# Patient Record
Sex: Male | Born: 1945 | Race: White | Hispanic: No | Marital: Single | Smoking: Former smoker
Health system: Southern US, Community
[De-identification: ages and names within clinical notes are randomized; demographics above are authoritative.]

## PROBLEM LIST (undated history)

## (undated) DIAGNOSIS — C61 Malignant neoplasm of prostate: Secondary | ICD-10-CM

## (undated) DIAGNOSIS — I10 Essential (primary) hypertension: Secondary | ICD-10-CM

## (undated) DIAGNOSIS — E119 Type 2 diabetes mellitus without complications: Secondary | ICD-10-CM

## (undated) DIAGNOSIS — J45909 Unspecified asthma, uncomplicated: Secondary | ICD-10-CM

## (undated) DIAGNOSIS — D519 Vitamin B12 deficiency anemia, unspecified: Secondary | ICD-10-CM

## (undated) DIAGNOSIS — L409 Psoriasis, unspecified: Secondary | ICD-10-CM

## (undated) DIAGNOSIS — I255 Ischemic cardiomyopathy: Secondary | ICD-10-CM

## (undated) DIAGNOSIS — E039 Hypothyroidism, unspecified: Secondary | ICD-10-CM

## (undated) DIAGNOSIS — I2510 Atherosclerotic heart disease of native coronary artery without angina pectoris: Secondary | ICD-10-CM

## (undated) DIAGNOSIS — I219 Acute myocardial infarction, unspecified: Secondary | ICD-10-CM

## (undated) HISTORY — PX: APPENDECTOMY: SHX54

## (undated) HISTORY — PX: CORONARY ARTERY BYPASS GRAFT: SHX141

## (undated) HISTORY — PX: OTHER SURGICAL HISTORY: SHX169

---

## 2007-09-29 ENCOUNTER — Ambulatory Visit: Payer: Self-pay | Admitting: Gastroenterology

## 2008-03-08 ENCOUNTER — Ambulatory Visit: Payer: Self-pay | Admitting: Urology

## 2010-07-25 ENCOUNTER — Ambulatory Visit: Payer: Self-pay

## 2011-12-28 ENCOUNTER — Emergency Department: Payer: Self-pay | Admitting: Emergency Medicine

## 2014-01-02 DIAGNOSIS — L4050 Arthropathic psoriasis, unspecified: Secondary | ICD-10-CM | POA: Diagnosis present

## 2014-01-02 DIAGNOSIS — I2510 Atherosclerotic heart disease of native coronary artery without angina pectoris: Secondary | ICD-10-CM | POA: Insufficient documentation

## 2014-10-13 DIAGNOSIS — K649 Unspecified hemorrhoids: Secondary | ICD-10-CM | POA: Diagnosis not present

## 2014-10-13 DIAGNOSIS — Z08 Encounter for follow-up examination after completed treatment for malignant neoplasm: Secondary | ICD-10-CM | POA: Diagnosis not present

## 2014-10-13 DIAGNOSIS — N529 Male erectile dysfunction, unspecified: Secondary | ICD-10-CM | POA: Diagnosis not present

## 2014-10-13 DIAGNOSIS — R32 Unspecified urinary incontinence: Secondary | ICD-10-CM | POA: Diagnosis not present

## 2014-10-13 DIAGNOSIS — C61 Malignant neoplasm of prostate: Secondary | ICD-10-CM | POA: Diagnosis not present

## 2014-10-13 DIAGNOSIS — Z8546 Personal history of malignant neoplasm of prostate: Secondary | ICD-10-CM | POA: Diagnosis not present

## 2014-10-17 DIAGNOSIS — M1990 Unspecified osteoarthritis, unspecified site: Secondary | ICD-10-CM | POA: Diagnosis not present

## 2014-10-17 DIAGNOSIS — M150 Primary generalized (osteo)arthritis: Secondary | ICD-10-CM | POA: Diagnosis not present

## 2014-10-17 DIAGNOSIS — C61 Malignant neoplasm of prostate: Secondary | ICD-10-CM | POA: Diagnosis not present

## 2014-10-17 DIAGNOSIS — L4050 Arthropathic psoriasis, unspecified: Secondary | ICD-10-CM | POA: Diagnosis not present

## 2015-01-10 DIAGNOSIS — R0781 Pleurodynia: Secondary | ICD-10-CM | POA: Diagnosis not present

## 2015-01-10 DIAGNOSIS — R05 Cough: Secondary | ICD-10-CM | POA: Diagnosis not present

## 2015-01-18 DIAGNOSIS — I2510 Atherosclerotic heart disease of native coronary artery without angina pectoris: Secondary | ICD-10-CM | POA: Diagnosis not present

## 2015-01-18 DIAGNOSIS — J4522 Mild intermittent asthma with status asthmaticus: Secondary | ICD-10-CM | POA: Diagnosis not present

## 2015-01-18 DIAGNOSIS — D510 Vitamin B12 deficiency anemia due to intrinsic factor deficiency: Secondary | ICD-10-CM | POA: Diagnosis not present

## 2015-01-18 DIAGNOSIS — I10 Essential (primary) hypertension: Secondary | ICD-10-CM | POA: Diagnosis not present

## 2015-01-31 ENCOUNTER — Other Ambulatory Visit: Payer: Self-pay

## 2015-01-31 ENCOUNTER — Emergency Department: Payer: Commercial Managed Care - HMO

## 2015-01-31 ENCOUNTER — Encounter: Payer: Self-pay | Admitting: Emergency Medicine

## 2015-01-31 ENCOUNTER — Emergency Department
Admission: EM | Admit: 2015-01-31 | Discharge: 2015-01-31 | Disposition: A | Discharge status: Home / Self Care | Payer: Commercial Managed Care - HMO | Attending: Emergency Medicine | Admitting: Emergency Medicine

## 2015-01-31 DIAGNOSIS — R079 Chest pain, unspecified: Secondary | ICD-10-CM | POA: Diagnosis not present

## 2015-01-31 DIAGNOSIS — I10 Essential (primary) hypertension: Secondary | ICD-10-CM | POA: Diagnosis not present

## 2015-01-31 DIAGNOSIS — J90 Pleural effusion, not elsewhere classified: Secondary | ICD-10-CM | POA: Diagnosis not present

## 2015-01-31 DIAGNOSIS — R0602 Shortness of breath: Secondary | ICD-10-CM | POA: Diagnosis not present

## 2015-01-31 DIAGNOSIS — M6281 Muscle weakness (generalized): Secondary | ICD-10-CM | POA: Insufficient documentation

## 2015-01-31 DIAGNOSIS — J45901 Unspecified asthma with (acute) exacerbation: Secondary | ICD-10-CM | POA: Diagnosis not present

## 2015-01-31 DIAGNOSIS — J111 Influenza due to unidentified influenza virus with other respiratory manifestations: Secondary | ICD-10-CM | POA: Diagnosis not present

## 2015-01-31 DIAGNOSIS — Z87891 Personal history of nicotine dependence: Secondary | ICD-10-CM | POA: Diagnosis not present

## 2015-01-31 DIAGNOSIS — J209 Acute bronchitis, unspecified: Secondary | ICD-10-CM

## 2015-01-31 DIAGNOSIS — R55 Syncope and collapse: Secondary | ICD-10-CM | POA: Insufficient documentation

## 2015-01-31 DIAGNOSIS — E119 Type 2 diabetes mellitus without complications: Secondary | ICD-10-CM | POA: Diagnosis not present

## 2015-01-31 DIAGNOSIS — R42 Dizziness and giddiness: Secondary | ICD-10-CM | POA: Diagnosis not present

## 2015-01-31 DIAGNOSIS — A419 Sepsis, unspecified organism: Secondary | ICD-10-CM | POA: Diagnosis not present

## 2015-01-31 HISTORY — DX: Type 2 diabetes mellitus without complications: E11.9

## 2015-01-31 HISTORY — DX: Acute myocardial infarction, unspecified: I21.9

## 2015-01-31 HISTORY — DX: Malignant neoplasm of prostate: C61

## 2015-01-31 HISTORY — DX: Essential (primary) hypertension: I10

## 2015-01-31 HISTORY — DX: Unspecified asthma, uncomplicated: J45.909

## 2015-01-31 LAB — CBC WITH DIFFERENTIAL/PLATELET
BASOS PCT: 0 %
Basophils Absolute: 0.1 10*3/uL (ref 0–0.1)
EOS ABS: 0.1 10*3/uL (ref 0–0.7)
Eosinophils Relative: 1 %
HCT: 37.5 % — ABNORMAL LOW (ref 40.0–52.0)
Hemoglobin: 13.3 g/dL (ref 13.0–18.0)
Lymphocytes Relative: 8 %
Lymphs Abs: 0.9 10*3/uL — ABNORMAL LOW (ref 1.0–3.6)
MCH: 30.2 pg (ref 26.0–34.0)
MCHC: 35.6 g/dL (ref 32.0–36.0)
MCV: 84.9 fL (ref 80.0–100.0)
Monocytes Absolute: 1.6 10*3/uL — ABNORMAL HIGH (ref 0.2–1.0)
Monocytes Relative: 12 %
NEUTROS PCT: 79 %
Neutro Abs: 10 10*3/uL — ABNORMAL HIGH (ref 1.4–6.5)
PLATELETS: 171 10*3/uL (ref 150–440)
RBC: 4.41 MIL/uL (ref 4.40–5.90)
RDW: 13.5 % (ref 11.5–14.5)
WBC: 12.7 10*3/uL — ABNORMAL HIGH (ref 3.8–10.6)

## 2015-01-31 LAB — COMPREHENSIVE METABOLIC PANEL
ALK PHOS: 44 U/L (ref 38–126)
ALT: 20 U/L (ref 17–63)
ANION GAP: 9 (ref 5–15)
AST: 22 U/L (ref 15–41)
Albumin: 3.3 g/dL — ABNORMAL LOW (ref 3.5–5.0)
BILIRUBIN TOTAL: 2.1 mg/dL — AB (ref 0.3–1.2)
BUN: 24 mg/dL — AB (ref 6–20)
CHLORIDE: 99 mmol/L — AB (ref 101–111)
CO2: 23 mmol/L (ref 22–32)
Calcium: 8.6 mg/dL — ABNORMAL LOW (ref 8.9–10.3)
Creatinine, Ser: 1.13 mg/dL (ref 0.61–1.24)
GFR calc non Af Amer: >60 mL/min (ref >60)
GLUCOSE: 134 mg/dL — AB (ref 65–99)
Potassium: 3.4 mmol/L — ABNORMAL LOW (ref 3.5–5.1)
SODIUM: 131 mmol/L — AB (ref 135–145)
TOTAL PROTEIN: 6.5 g/dL (ref 6.5–8.1)

## 2015-01-31 LAB — TROPONIN I: Troponin I: <0.03 ng/mL (ref <0.031)

## 2015-01-31 MED ORDER — IOHEXOL 350 MG/ML SOLN
100.0000 mL | Freq: Once | INTRAVENOUS | Status: AC | PRN
  Administered 2015-01-31: 100 mL via INTRAVENOUS

## 2015-01-31 MED ORDER — IPRATROPIUM-ALBUTEROL 0.5-2.5 (3) MG/3ML IN SOLN
3.0000 mL | Freq: Once | RESPIRATORY_TRACT | Status: AC
  Administered 2015-01-31: 3 mL via RESPIRATORY_TRACT

## 2015-01-31 MED ORDER — IPRATROPIUM-ALBUTEROL 0.5-2.5 (3) MG/3ML IN SOLN
RESPIRATORY_TRACT | Status: AC
  Administered 2015-01-31: 3 mL via RESPIRATORY_TRACT
  Filled 2015-01-31: qty 3

## 2015-01-31 MED ORDER — SODIUM CHLORIDE 0.9 % IV BOLUS (SEPSIS)
1000.0000 mL | Freq: Once | INTRAVENOUS | Status: AC
  Administered 2015-01-31: 1000 mL via INTRAVENOUS

## 2015-01-31 NOTE — ED Notes (Signed)
Returned from CT.

## 2015-01-31 NOTE — Discharge Instructions (Signed)
You were evaluated for coughing, left sided chest pain, low blood pressure with dizziness and your exam and evaluation are reassuring in the emergency department. Return to the emergency department for any fever, new or worsening chest pain, trouble breathing, shortness breath, weakness, numbness, passing out, or any other symptoms concerning to you. You need to see your regular doctor for a follow-up this week or early next week.  Acute Bronchitis Bronchitis is inflammation of the airways that extend from the windpipe into the lungs (bronchi). The inflammation often causes mucus to develop. This leads to a cough, which is the most common symptom of bronchitis.  In acute bronchitis, the condition usually develops suddenly and goes away over time, usually in a couple weeks. Smoking, allergies, and asthma can make bronchitis worse. Repeated episodes of bronchitis may cause further lung problems.  CAUSES Acute bronchitis is most often caused by the same virus that causes a cold. The virus can spread from person to person (contagious) through coughing, sneezing, and touching contaminated objects. SIGNS AND SYMPTOMS   Cough.   Fever.   Coughing up mucus.   Body aches.   Chest congestion.   Chills.   Shortness of breath.   Sore throat.  DIAGNOSIS  Acute bronchitis is usually diagnosed through a physical exam. Your health care provider will also ask you questions about your medical history. Tests, such as chest X-rays, are sometimes done to rule out other conditions.  TREATMENT  Acute bronchitis usually goes away in a couple weeks. Oftentimes, no medical treatment is necessary. Medicines are sometimes given for relief of fever or cough. Antibiotic medicines are usually not needed but may be prescribed in certain situations. In some cases, an inhaler may be recommended to help reduce shortness of breath and control the cough. A cool mist vaporizer may also be used to help thin bronchial  secretions and make it easier to clear the chest.  HOME CARE INSTRUCTIONS  Get plenty of rest.   Drink enough fluids to keep your urine clear or pale yellow (unless you have a medical condition that requires fluid restriction). Increasing fluids may help thin your respiratory secretions (sputum) and reduce chest congestion, and it will prevent dehydration.   Take medicines only as directed by your health care provider.  If you were prescribed an antibiotic medicine, finish it all even if you start to feel better.  Avoid smoking and secondhand smoke. Exposure to cigarette smoke or irritating chemicals will make bronchitis worse. If you are a smoker, consider using nicotine gum or skin patches to help control withdrawal symptoms. Quitting smoking will help your lungs heal faster.   Reduce the chances of another bout of acute bronchitis by washing your hands frequently, avoiding people with cold symptoms, and trying not to touch your hands to your mouth, nose, or eyes.   Keep all follow-up visits as directed by your health care provider.  SEEK MEDICAL CARE IF: Your symptoms do not improve after 1 week of treatment.  SEEK IMMEDIATE MEDICAL CARE IF:  You develop an increased fever or chills.   You have chest pain.   You have severe shortness of breath.  You have bloody sputum.   You develop dehydration.  You faint or repeatedly feel like you are going to pass out.  You develop repeated vomiting.  You develop a severe headache. MAKE SURE YOU:   Understand these instructions.  Will watch your condition.  Will get help right away if you are not doing well or  get worse. Document Released: 10/10/2004 Document Revised: 01/17/2014 Document Reviewed: 02/23/2013 [facility] Patient Information 2015 [place], [place]. This information is not intended to replace advice given to you by your health care provider. Make sure you discuss any questions you have with your health care  provider.

## 2015-01-31 NOTE — ED Notes (Signed)
Pt to ed with c/o chest pain that started about 10 am today.  Pt reports pain is in left side of chest, reports sob, weakness, dizziness, nausea and profuse diaphoresis with pain.

## 2015-01-31 NOTE — ED Provider Notes (Signed)
[facility] Emergency Department Provider Note   ____________________________________________  Time seen: 5:30 PM I have reviewed the triage vital signs and the triage nursing note.  HISTORY  Chief Complaint Chest Pain   Historian Patient, limited as he is a poor historian Son provides more history  HPI Antonio Barnes is a 69 y.o. male whose complaint is generalized weakness, low blood pressure with dizziness and near-syncope at his primary care doctor's office today. He has been sick for "5 weeks ". He's had shortness of breath and wheezing. He's been treated with 3 courses of prednisone per the patient and swell as breathing treatment inhalers. He's had 2 courses of antibiotics which appears to have been Augmentin. He had a chest x-ray approximately 3-4 weeks ago when he was having left rib pain. For the past 1-2 days he's had very little to eat and drink and is concerned he may be dehydrated. He does take blood pressure pills. 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 His son feels like his color is off.He reports continued shortness of breath. He did have some chest pain around 10 AM today on the left side with nausea and sweating. No chest pain now    Past Medical History  Diagnosis Date  . Hypertension   . Diabetes mellitus without complication   . Prostate cancer   . Myocardial infarct   . Asthma     There are no active problems to display for this patient.   Past Surgical History  Procedure Laterality Date  . Coronary artery bypass graft      No current outpatient prescriptions on file.  Allergies Review of patient's allergies indicates no known allergies.  No family history on file.  Social History History  Substance Use Topics  . Smoking status: Former Research scientist (life sciences)  . Smokeless tobacco: Not on file  . Alcohol Use: Yes    Review of Systems  Constitutional: Negative for fever. Positive for sweats at night and during the day. Eyes:  Negative for visual changes. ENT: Negative for sore throat. Cardiovascular: No palpitations Respiratory: Wheezing and shortness of breath per history of present illness Gastrointestinal: Negative for abdominal pain, vomiting and diarrhea. Genitourinary: Negative for dysuria. Musculoskeletal: Negative for back pain. Skin: Negative for rash. Neurological: Negative for headaches, focal weakness or numbness.  ____________________________________________   PHYSICAL EXAM:  VITAL SIGNS: ED Triage Vitals  Enc Vitals Group     BP 01/31/15 1649 97/58 mmHg     Pulse Rate 01/31/15 1649 76     Resp 01/31/15 1649 20     Temp 01/31/15 1649 98.9 F (37.2 C)     Temp Source 01/31/15 1649 Oral     SpO2 01/31/15 1649 94 %     Weight 01/31/15 1649 215 lb (97.523 kg)     Height 01/31/15 1649 6\' 2"  (1.88 m)     Head Cir --      Peak Flow --      Pain Score 01/31/15 1651 5     Pain Loc --      Pain Edu? --      Excl. in [place]? --      Constitutional: Alert and oriented. Well appearing and in no distress. However slow to answer questions. Poor historian Eyes: Conjunctivae are normal. PERRL. Normal extraocular movements. ENT   Head: Normocephalic and atraumatic.   Nose: No congestion/rhinnorhea.   Mouth/Throat: Mucous membranes are moderately dry.   Neck: No stridor. Cardiovascular: Normal rate, regular rhythm.  No murmurs, rubs, or gallops. Respiratory: Some wheezing and rhonchi throughout all fields, however no respiratory distress.. Gastrointestinal: Soft and nontender. No distention.  Genitourinary: Musculoskeletal: Nontender with normal range of motion in all extremities. No joint effusions.  No lower extremity tenderness nor edema. Neurologic:  Normal speech and language. No gross focal neurologic deficits are appreciated. Speech is normal. Slow to answer questions but appropriate and following commands. Skin:  Skin is warm, dry and intact. No rash noted. Psychiatric: Mood and  affect are normal.  ____________________________________________   EKG  74 bpm. Normal sinus rhythm. Normal axis. Left ventricular hypertrophy with repolarization changes. ____________________________________________  LABS (pertinent positives/negatives)  Sodium 131 Potassium 3.4 BUN 24 with cranial 1.13 and a GFR of greater than 60 Troponin less than 0.03 White blood cell count 12.7 and hemoglobin 13.3  ____________________________________________  RADIOLOGY Radiologist results reviewed  Chest x-ray: New small left pleural effusion and left base atelectasis with slight cardiomegaly  CT and chest: Negative for PE or thoracic aortic dissection: Small left pleural effusion with adjacent consolidation/atelectasis in the posterior left lower lobe __________________________________________  PROCEDURES  Procedure(s) performed: No Critical Care performed: No  ____________________________________________   ED COURSE / ASSESSMENT AND PLAN  Pertinent labs & imaging results that were available during my care of the patient were reviewed by me and considered in my medical decision making (see chart for details).  Patient has had wheezing for several weeks and been treated for bronchitis by his primary care physician. Some pleuritic chest pain and wheezing, after nonstick chest x-ray a CT scan to rule out PE was obtained and there is no acute abnormality seen there. I suspect findings on the left lung R due to atelectasis. I do not evaluate and adding additional advice at this point in time. Patient is to follow-up with primary care physician and possibly be referred to pulmonology.    Patient / Family / Caregiver informed of clinical course, understand medical decision-making process, and agree with plan.  ___________________________________________   FINAL CLINICAL IMPRESSION(S) / ED DIAGNOSES   Acute bronchitis, unspecified organism    Lisa Roca, MD 02/03/15 1339

## 2015-02-20 DIAGNOSIS — D510 Vitamin B12 deficiency anemia due to intrinsic factor deficiency: Secondary | ICD-10-CM | POA: Diagnosis not present

## 2015-02-20 DIAGNOSIS — I2510 Atherosclerotic heart disease of native coronary artery without angina pectoris: Secondary | ICD-10-CM | POA: Diagnosis not present

## 2015-02-27 DIAGNOSIS — J90 Pleural effusion, not elsewhere classified: Secondary | ICD-10-CM | POA: Diagnosis not present

## 2015-02-27 DIAGNOSIS — I2510 Atherosclerotic heart disease of native coronary artery without angina pectoris: Secondary | ICD-10-CM | POA: Diagnosis not present

## 2015-02-27 DIAGNOSIS — E039 Hypothyroidism, unspecified: Secondary | ICD-10-CM | POA: Diagnosis not present

## 2015-02-27 DIAGNOSIS — D510 Vitamin B12 deficiency anemia due to intrinsic factor deficiency: Secondary | ICD-10-CM | POA: Diagnosis not present

## 2015-02-27 DIAGNOSIS — Z0000 Encounter for general adult medical examination without abnormal findings: Secondary | ICD-10-CM | POA: Diagnosis not present

## 2015-03-13 ENCOUNTER — Other Ambulatory Visit: Payer: Self-pay | Admitting: Specialist

## 2015-03-13 DIAGNOSIS — J90 Pleural effusion, not elsewhere classified: Secondary | ICD-10-CM | POA: Diagnosis not present

## 2015-03-16 ENCOUNTER — Ambulatory Visit: Payer: Commercial Managed Care - HMO

## 2015-03-17 ENCOUNTER — Other Ambulatory Visit: Payer: Self-pay | Admitting: Radiology

## 2015-03-21 ENCOUNTER — Ambulatory Visit
Admission: RE | Admit: 2015-03-21 | Discharge: 2015-03-21 | Disposition: A | Discharge status: Home / Self Care | Payer: Commercial Managed Care - HMO | Source: Ambulatory Visit | Attending: Specialist | Admitting: Specialist

## 2015-03-21 DIAGNOSIS — J90 Pleural effusion, not elsewhere classified: Secondary | ICD-10-CM | POA: Insufficient documentation

## 2015-03-21 DIAGNOSIS — M1990 Unspecified osteoarthritis, unspecified site: Secondary | ICD-10-CM | POA: Insufficient documentation

## 2015-03-21 DIAGNOSIS — L409 Psoriasis, unspecified: Secondary | ICD-10-CM | POA: Insufficient documentation

## 2015-03-21 DIAGNOSIS — J45909 Unspecified asthma, uncomplicated: Secondary | ICD-10-CM | POA: Insufficient documentation

## 2015-03-21 DIAGNOSIS — I10 Essential (primary) hypertension: Secondary | ICD-10-CM | POA: Diagnosis not present

## 2015-03-21 DIAGNOSIS — Z8546 Personal history of malignant neoplasm of prostate: Secondary | ICD-10-CM | POA: Diagnosis not present

## 2015-03-21 DIAGNOSIS — I2510 Atherosclerotic heart disease of native coronary artery without angina pectoris: Secondary | ICD-10-CM | POA: Diagnosis not present

## 2015-03-21 DIAGNOSIS — Z9889 Other specified postprocedural states: Secondary | ICD-10-CM | POA: Diagnosis not present

## 2015-03-21 DIAGNOSIS — E039 Hypothyroidism, unspecified: Secondary | ICD-10-CM | POA: Diagnosis not present

## 2015-03-21 DIAGNOSIS — D519 Vitamin B12 deficiency anemia, unspecified: Secondary | ICD-10-CM | POA: Diagnosis not present

## 2015-03-21 DIAGNOSIS — J984 Other disorders of lung: Secondary | ICD-10-CM | POA: Diagnosis not present

## 2015-03-21 LAB — PROTEIN, BODY FLUID: Total protein, fluid: 5.2 g/dL

## 2015-03-21 LAB — BODY FLUID CELL COUNT WITH DIFFERENTIAL
Eos, Fluid: 0 %
Lymphs, Fluid: 81 %
MONOCYTE-MACROPHAGE-SEROUS FLUID: 12 % — AB (ref 50–90)
NEUTROPHIL FLUID: 7 % (ref 0–25)
OTHER CELLS FL: 0 %
Total Nucleated Cell Count, Fluid: 1913 cu mm — ABNORMAL HIGH (ref 0–1000)

## 2015-03-21 LAB — LACTATE DEHYDROGENASE, PLEURAL OR PERITONEAL FLUID: LD, Fluid: 338 U/L — ABNORMAL HIGH (ref 3–23)

## 2015-03-21 LAB — GLUCOSE, SEROUS FLUID: Glucose, Fluid: 103 mg/dL

## 2015-03-21 NOTE — Procedures (Signed)
Informed consent obtained. Under US guidance, left thoracentesis was performed. CXR to follow. No immediate complications.

## 2015-03-24 LAB — BODY FLUID CULTURE: Culture: NO GROWTH

## 2015-03-27 DIAGNOSIS — Z111 Encounter for screening for respiratory tuberculosis: Secondary | ICD-10-CM | POA: Diagnosis not present

## 2015-03-27 DIAGNOSIS — J90 Pleural effusion, not elsewhere classified: Secondary | ICD-10-CM | POA: Diagnosis not present

## 2015-03-30 LAB — CYTOLOGY - NON PAP

## 2015-04-07 LAB — PATHOLOGIST SMEAR REVIEW

## 2015-04-11 LAB — FUNGUS CULTURE W SMEAR: Culture: NO GROWTH

## 2015-04-17 DIAGNOSIS — J90 Pleural effusion, not elsewhere classified: Secondary | ICD-10-CM | POA: Diagnosis not present

## 2015-04-17 DIAGNOSIS — Z8709 Personal history of other diseases of the respiratory system: Secondary | ICD-10-CM | POA: Diagnosis not present

## 2015-04-18 DIAGNOSIS — J90 Pleural effusion, not elsewhere classified: Secondary | ICD-10-CM | POA: Diagnosis not present

## 2015-05-04 LAB — ACID FAST SMEAR+CULTURE W/RFLX (ARMC ONLY)
Acid Fast Culture: NEGATIVE
Acid Fast Smear: NEGATIVE

## 2015-05-16 DIAGNOSIS — L4050 Arthropathic psoriasis, unspecified: Secondary | ICD-10-CM | POA: Diagnosis not present

## 2015-06-23 DIAGNOSIS — L400 Psoriasis vulgaris: Secondary | ICD-10-CM | POA: Diagnosis not present

## 2015-06-23 DIAGNOSIS — Z79899 Other long term (current) drug therapy: Secondary | ICD-10-CM | POA: Diagnosis not present

## 2015-07-11 IMAGING — CR DG CHEST 2V
2 series · 2 of 2 positions shown · non-contrast
Comparison: CT scan of the chest dated January 31, 2015.

CLINICAL DATA: Post thoracentesis chest x-ray

EXAM:
CHEST  2 VIEW

[chest pa]
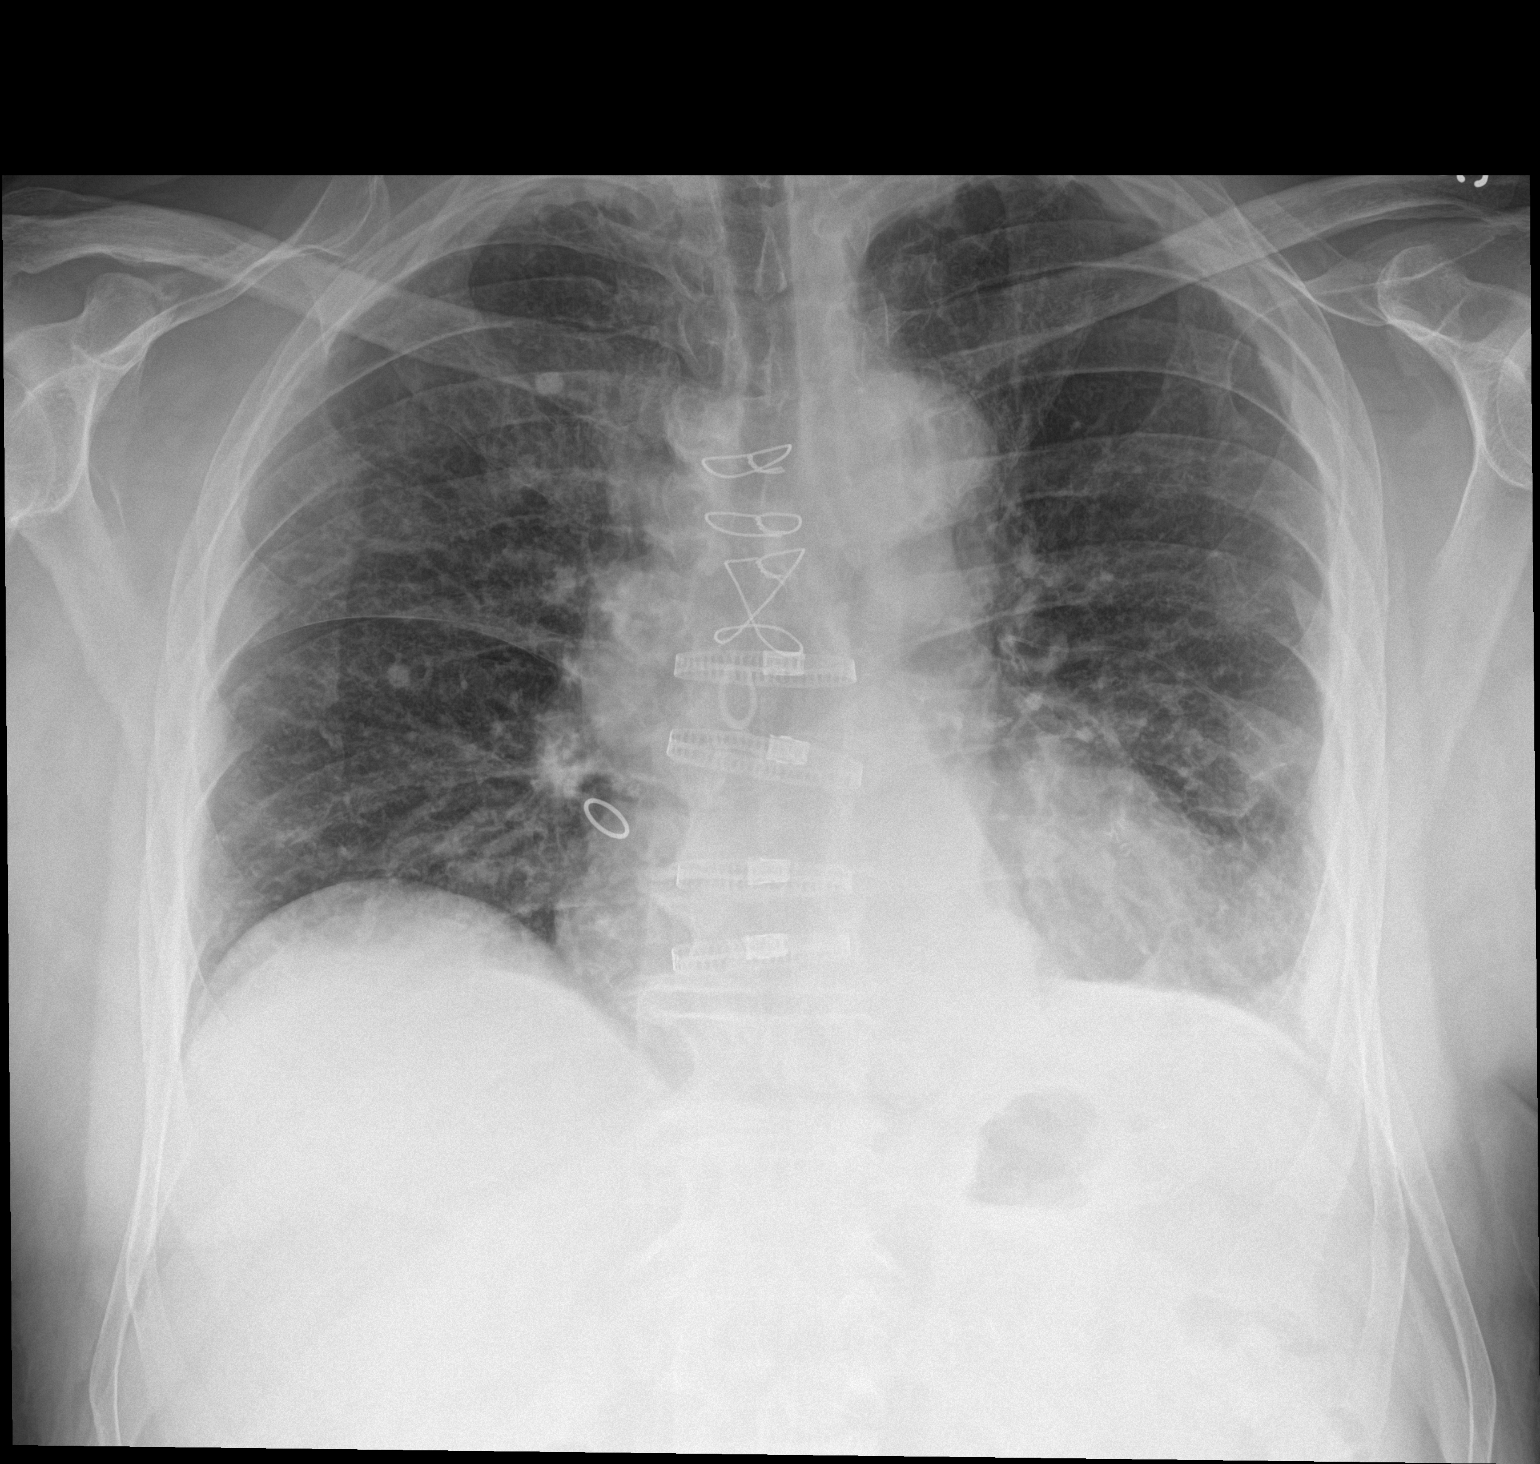

[chest lat]
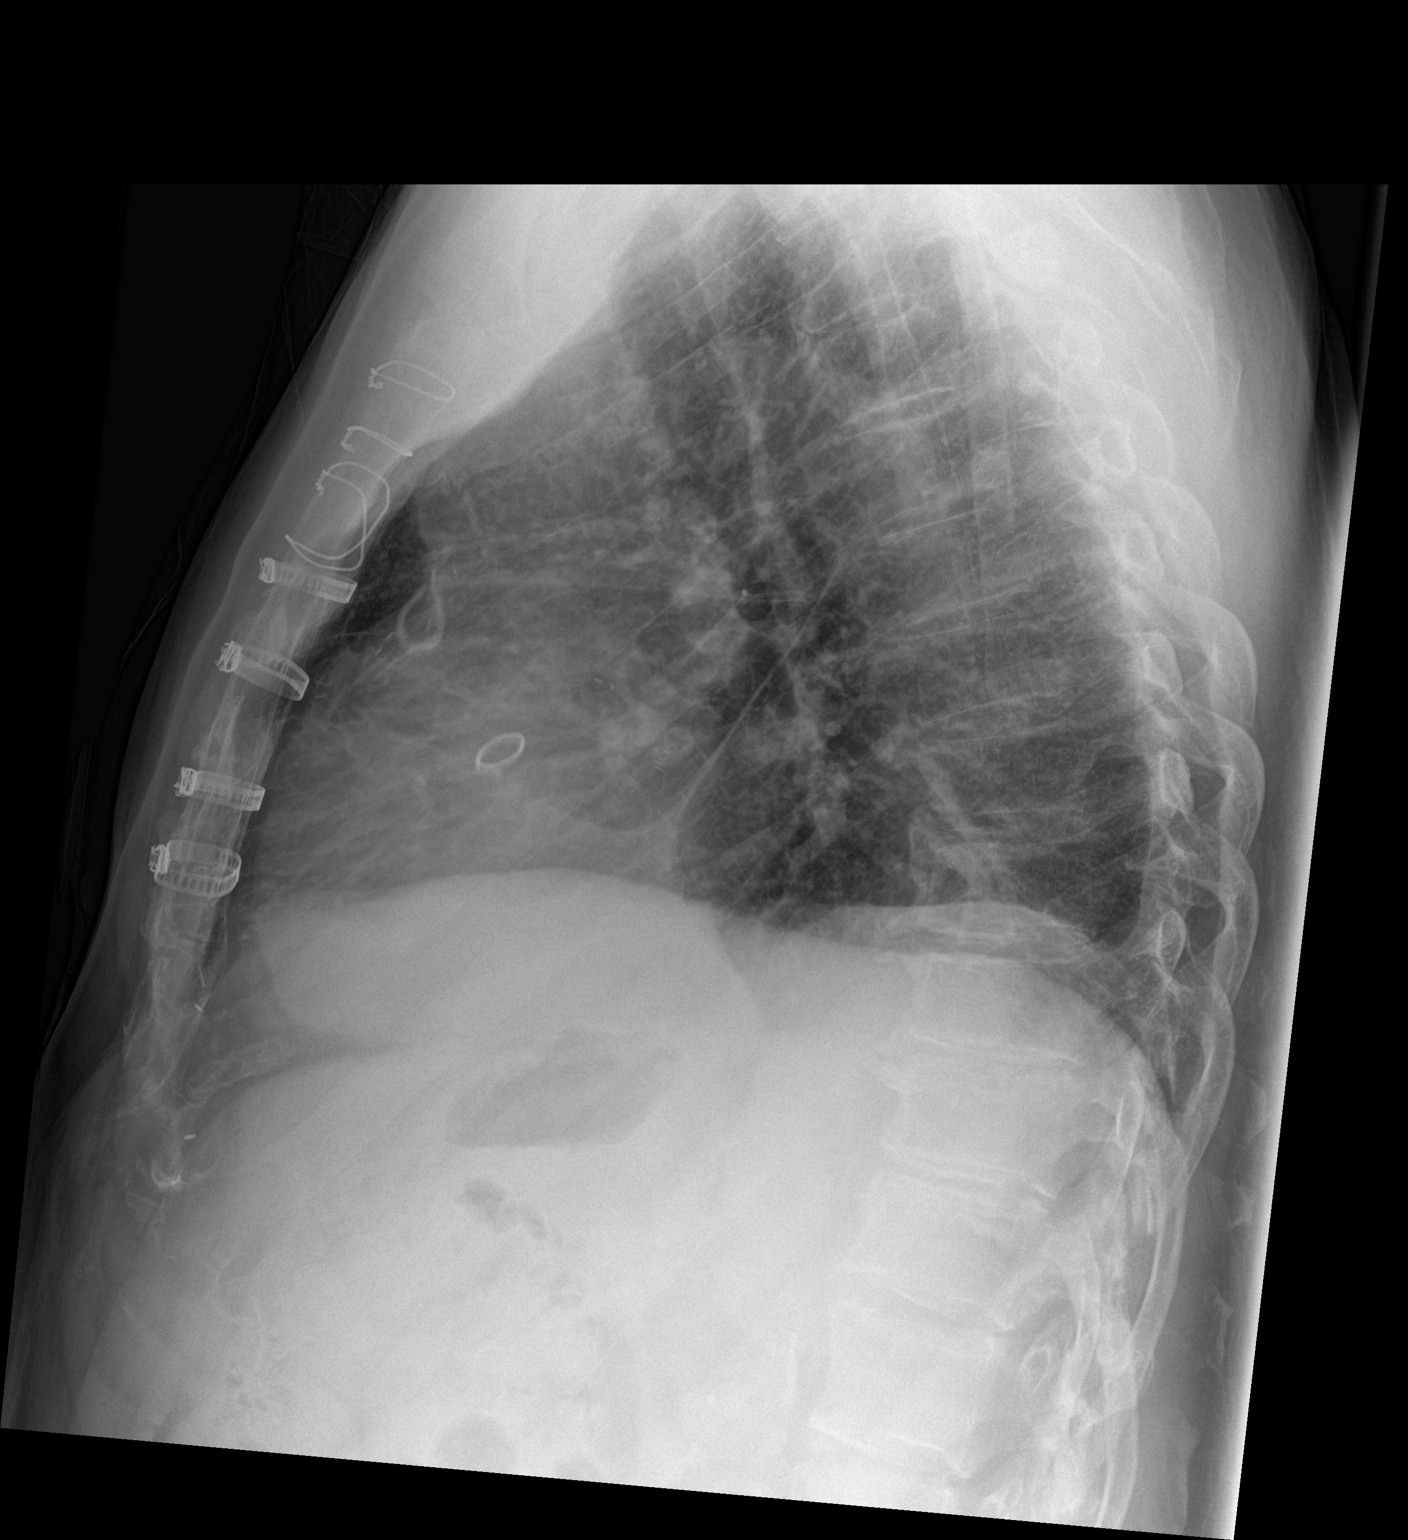

[2 of 2 positions shown; findings below may reference images not displayed]

FINDINGS: The patient has undergone left-sided thoracentesis. A small amount
of pleural fluid remains. There is no pneumothorax. There is stable
retrosternal soft tissue density when compared to a chest x-ray July 25, 2010. There is no mediastinal shift. The heart is
top-normal in size. The pulmonary vascularity is not engorged. The
pulmonary interstitial markings are mildly increased. There are 4
intact sternal clamps in 3 intact sternal wires from previous CABG.
No acute bony abnormality is observed.
IMPRESSION: The patient has undergone successful left-sided thoracentesis
without evidence of postprocedure complication.

## 2015-07-11 IMAGING — US US THORACENTESIS ASP PLEURAL SPACE W/IMG GUIDE
1 series · 2 of 2 positions shown · non-contrast
Comparison: None.

CLINICAL DATA: Left pleural effusion.

EXAM:
ULTRASOUND GUIDED left THORACENTESIS

[Series 1: us thoracentesis asp pleural space w/img guide · 0.26mm/px · 2 of 2 slices shown]
[im 1/2]
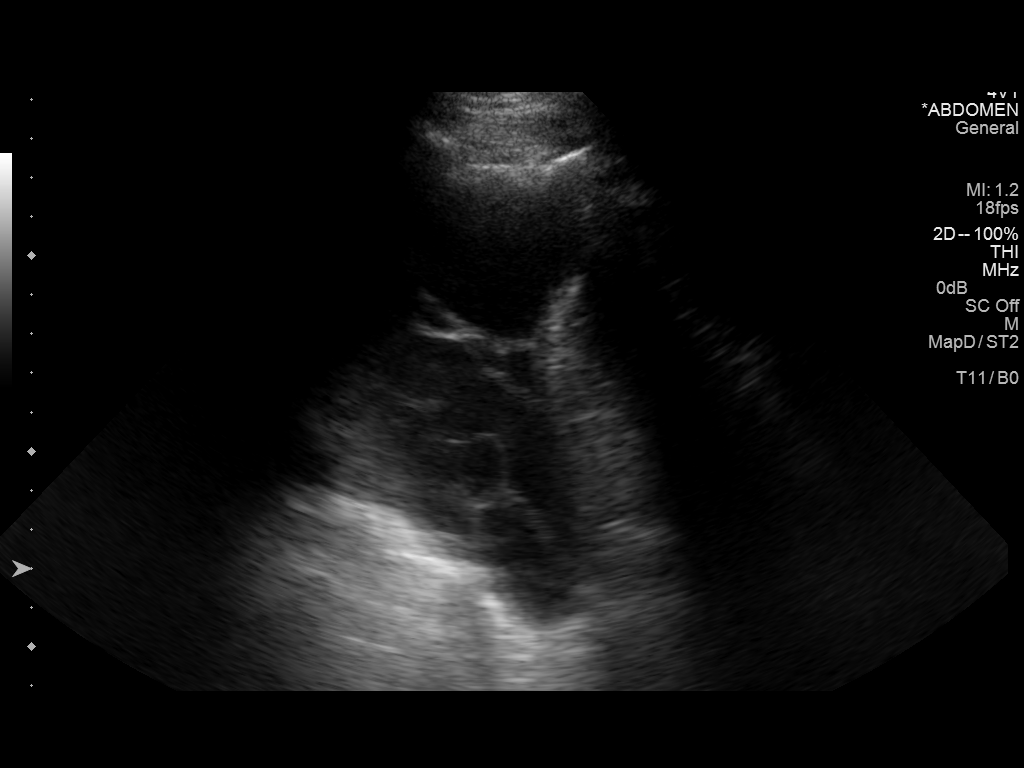
[im 2/2]
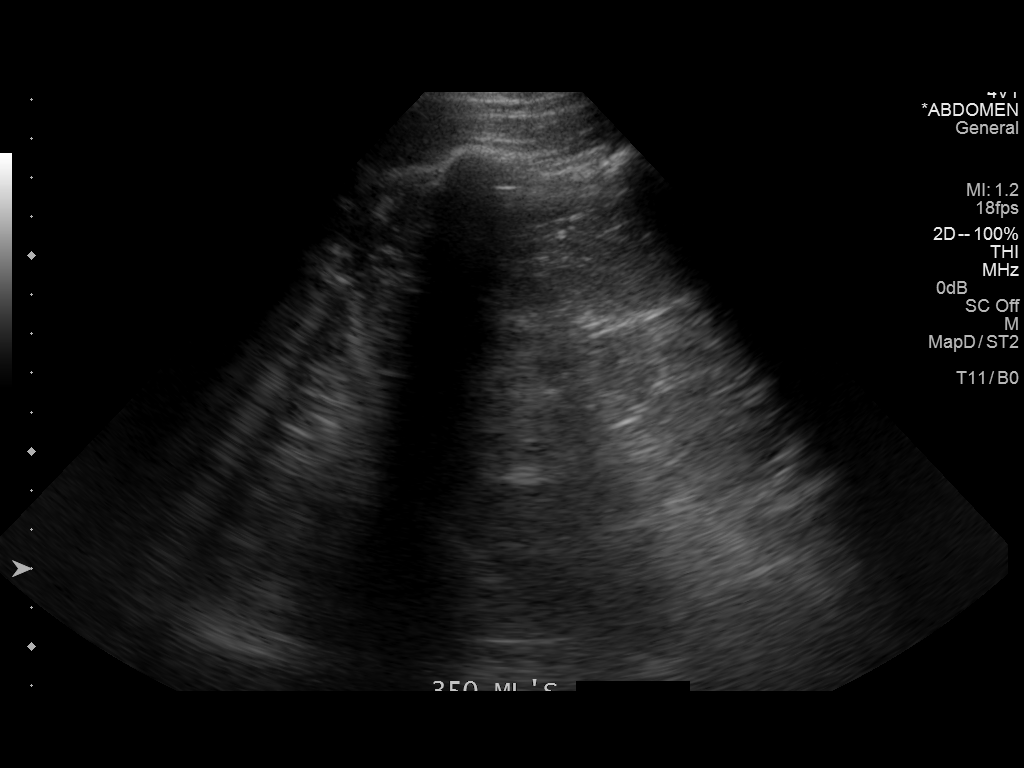

[2 of 2 positions shown; findings below may reference images not displayed]

PROCEDURE:
An ultrasound guided thoracentesis was thoroughly discussed with the
patient and questions answered. The benefits, risks, alternatives
and complications were also discussed. The patient understands and
wishes to proceed with the procedure. Written consent was obtained.

Ultrasound was performed to localize and mark an adequate pocket of
fluid in the left chest. The area was then prepped and draped in the
normal sterile fashion. 1% Lidocaine was used for local anesthesia.
Under ultrasound guidance a Safe-T-Centesis catheter was introduced.
Thoracentesis was performed. The catheter was removed and a dressing
applied.

Complications:  None immediate.
FINDINGS: A total of approximately 350 mL of serous fluid was removed. A fluid
sample wassent for laboratory analysis.
IMPRESSION: Successful ultrasound guided left thoracentesis yielding 350 mL of
pleural fluid.

## 2015-09-11 ENCOUNTER — Emergency Department
Admission: EM | Admit: 2015-09-11 | Discharge: 2015-09-11 | Disposition: A | Discharge status: Home / Self Care | Payer: Commercial Managed Care - HMO | Attending: Emergency Medicine | Admitting: Emergency Medicine

## 2015-09-11 ENCOUNTER — Encounter: Payer: Self-pay | Admitting: Emergency Medicine

## 2015-09-11 DIAGNOSIS — Z79899 Other long term (current) drug therapy: Secondary | ICD-10-CM | POA: Insufficient documentation

## 2015-09-11 DIAGNOSIS — Z87891 Personal history of nicotine dependence: Secondary | ICD-10-CM | POA: Diagnosis not present

## 2015-09-11 DIAGNOSIS — I10 Essential (primary) hypertension: Secondary | ICD-10-CM | POA: Insufficient documentation

## 2015-09-11 DIAGNOSIS — D571 Sickle-cell disease without crisis: Secondary | ICD-10-CM | POA: Diagnosis not present

## 2015-09-11 DIAGNOSIS — E119 Type 2 diabetes mellitus without complications: Secondary | ICD-10-CM | POA: Insufficient documentation

## 2015-09-11 DIAGNOSIS — J0100 Acute maxillary sinusitis, unspecified: Secondary | ICD-10-CM | POA: Diagnosis not present

## 2015-09-11 DIAGNOSIS — B9689 Other specified bacterial agents as the cause of diseases classified elsewhere: Secondary | ICD-10-CM

## 2015-09-11 DIAGNOSIS — Z7951 Long term (current) use of inhaled steroids: Secondary | ICD-10-CM | POA: Insufficient documentation

## 2015-09-11 DIAGNOSIS — N289 Disorder of kidney and ureter, unspecified: Secondary | ICD-10-CM | POA: Insufficient documentation

## 2015-09-11 DIAGNOSIS — L989 Disorder of the skin and subcutaneous tissue, unspecified: Secondary | ICD-10-CM | POA: Diagnosis present

## 2015-09-11 DIAGNOSIS — J320 Chronic maxillary sinusitis: Secondary | ICD-10-CM

## 2015-09-11 DIAGNOSIS — L089 Local infection of the skin and subcutaneous tissue, unspecified: Secondary | ICD-10-CM | POA: Diagnosis not present

## 2015-09-11 MED ORDER — FEXOFENADINE-PSEUDOEPHED ER 60-120 MG PO TB12: 1.0000 | ORAL_TABLET | Freq: Two times a day (BID) | ORAL | Status: DC

## 2015-09-11 MED ORDER — MUPIROCIN 2 % EX OINT: TOPICAL_OINTMENT | CUTANEOUS | Status: AC

## 2015-09-11 MED ORDER — CLINDAMYCIN HCL 150 MG PO CAPS: 150.0000 mg | ORAL_CAPSULE | Freq: Four times a day (QID) | ORAL | Status: DC

## 2015-09-11 NOTE — ED Notes (Signed)
Pt has scabbed area on right side of face next to his nose, and another scabbed area on his nose, right sided redness and swelling noted. Pt states he thinks he has a sinus infection, having nasal congestion past few days.

## 2015-09-11 NOTE — ED Provider Notes (Signed)
[facility] Emergency Department Provider Note  ____________________________________________  Time seen: Approximately 10:24 AM  I have reviewed the triage vital signs and the nursing notes.   HISTORY  Chief Complaint Skin Ulcer    HPI Antonio Barnes is a 69 y.o. male patient had 2 areas of erythematous papular lesions with some mild drainage on the facial area. First lesionright maxillary area and second lesion is on his right nostril area. Patient stated prior to appears this lesions she's had approximately 10 days of sinus congestion.  Patient denies any fever or chills associated this complaint. Patient rates his pain discomfort as a 2/10. No palliative measures taken for this complaint. Past Medical History  Diagnosis Date  . Hypertension   . Diabetes mellitus without complication ([place])   . Prostate cancer ([place])   . Myocardial infarct ([place])   . Asthma     There are no active problems to display for this patient.   Past Surgical History  Procedure Laterality Date  . Coronary artery bypass graft      Current Outpatient Rx  Name  Route  Sig  Dispense  Refill  . Albuterol (VENTOLIN IN)   Inhalation   Inhale into the lungs 2 (two) times daily.         Marland Kitchen amLODipine-benazepril (LOTREL) 5-20 MG per capsule   Oral   Take 1 capsule by mouth daily.         . betamethasone dipropionate (DIPROLENE) 0.05 % cream   Topical   Apply topically 2 (two) times daily.         . bisoprolol-hydrochlorothiazide (ZIAC) 2.5-6.25 MG per tablet   Oral   Take 1 tablet by mouth daily.         . clindamycin (CLEOCIN) 150 MG capsule   Oral   Take 1 capsule (150 mg total) by mouth 4 (four) times daily.   40 capsule   0   . cyanocobalamin 100 MCG tablet   Oral   Take 100 mcg by mouth daily.         Marland Kitchen Ephedrine-Guaifenesin 12.5-200 MG TABS   Oral   Take by mouth.         . ezetimibe (ZETIA) 10 MG tablet   Oral   Take 10 mg by mouth daily.        . fexofenadine-pseudoephedrine (ALLEGRA-D) 60-120 MG 12 hr tablet   Oral   Take 1 tablet by mouth 2 (two) times daily.   30 tablet   0   . levothyroxine (SYNTHROID, LEVOTHROID) 75 MCG tablet   Oral   Take 75 mcg by mouth daily before breakfast.         . mupirocin ointment (BACTROBAN) 2 %      Apply to affected area 3 times daily   22 g   0   . naproxen sodium (ANAPROX) 220 MG tablet   Oral   Take 220 mg by mouth as needed.         . sildenafil (REVATIO) 20 MG tablet   Oral   Take 40 mg by mouth every evening.         . simvastatin (ZOCOR) 40 MG tablet   Oral   Take 40 mg by mouth daily.           Allergies Review of patient's allergies indicates no known allergies.  No family history on file.  Social History Social History  Substance Use Topics  . Smoking status: Former Research scientist (life sciences)  .  Smokeless tobacco: None  . Alcohol Use: Yes    Review of Systems Constitutional: No fever/chills Eyes: No visual changes. ENT: No sore throat. Sinus congestion right facial swelling Cardiovascular: Denies chest pain. Respiratory: Denies shortness of breath. Gastrointestinal: No abdominal pain.  No nausea, no vomiting.  No diarrhea.  No constipation. Genitourinary: Negative for dysuria. Musculoskeletal: Negative for back pain. Skin: Negative for rash. Erythema and edema right facial area. Neurological: Negative for headaches, focal weakness or numbness. Endocrine:Hypertension and diabetes. Hematological/Lymphatic:Sickle cell anemia and renal insufficiency. 10-point ROS otherwise negative.  ____________________________________________   PHYSICAL EXAM:  VITAL SIGNS: ED Triage Vitals  Enc Vitals Group     BP 09/11/15 1001 171/103 mmHg     Pulse Rate 09/11/15 1001 73     Resp 09/11/15 1001 18     Temp 09/11/15 1001 97.6 F (36.4 C)     Temp Source 09/11/15 1001 Oral     SpO2 09/11/15 1001 97 %     Weight 09/11/15 1001 215 lb (97.523 kg)     Height 09/11/15  1001 6\' 2"  (1.88 m)     Head Cir --      Peak Flow --      Pain Score 09/11/15 1009 2     Pain Loc --      Pain Edu? --      Excl. in [place]? --     Constitutional: Alert and oriented. Well appearing and in no acute distress. Eyes: Conjunctivae are normal. PERRL. EOMI. Head: Atraumatic. Nose: Right maxillary guarding with epididymis bilateral nasal turbinates.. Mouth/Throat: Mucous membranes are moist.  Oropharynx non-erythematous. Neck: No stridor.  No cervical spine tenderness to palpation. Hematological/Lymphatic/Immunilogical: No cervical lymphadenopathy. Cardiovascular: Normal rate, regular rhythm. Grossly normal heart sounds.  Good peripheral circulation. Elevated blood pressure Respiratory: Normal respiratory effort.  No retractions. Lungs CTAB. Gastrointestinal: Soft and nontender. No distention. No abdominal bruits. No CVA tenderness. Musculoskeletal: No lower extremity tenderness nor edema.  No joint effusions. Neurologic:  Normal speech and language. No gross focal neurologic deficits are appreciated. No gait instability. Skin:  Skin is warm, dry and intact. No rash noted. Dry honey-colored crusted papular lesions right maxillary and right nostril area.  Psychiatric: Mood and affect are normal. Speech and behavior are normal.  ____________________________________________   LABS (all labs ordered are listed, but only abnormal results are displayed)  Labs Reviewed - No data to display ____________________________________________  EKG   ____________________________________________  RADIOLOGY   ____________________________________________   PROCEDURES  Procedure(s) performed: None  Critical Care performed: No  ____________________________________________   INITIAL IMPRESSION / ASSESSMENT AND PLAN / ED COURSE  Pertinent labs & imaging results that were available during my care of the patient were reviewed by me and considered in my medical decision making (see  chart for details).  Sinusitis and impetigo. Patient get a prescription for clindamycin, Bactroban, and Allegra-D. Patient advised follow-up family doctor in 3 days for reevaluation. ____________________________________________   FINAL CLINICAL IMPRESSION(S) / ED DIAGNOSES  Final diagnoses:  Right maxillary sinusitis  Localized bacterial skin infection      Sable Feil, PA-C 09/11/15 1044  Lavonia Drafts, MD 09/11/15 [phone]

## 2015-09-11 NOTE — Discharge Instructions (Signed)
Medications directed and follow-up family doctor.

## 2015-09-13 DIAGNOSIS — L0201 Cutaneous abscess of face: Secondary | ICD-10-CM | POA: Diagnosis not present

## 2015-09-19 DIAGNOSIS — L03211 Cellulitis of face: Secondary | ICD-10-CM | POA: Diagnosis not present

## 2015-09-19 DIAGNOSIS — L409 Psoriasis, unspecified: Secondary | ICD-10-CM | POA: Diagnosis not present

## 2015-09-19 DIAGNOSIS — E039 Hypothyroidism, unspecified: Secondary | ICD-10-CM | POA: Diagnosis not present

## 2015-09-19 DIAGNOSIS — D510 Vitamin B12 deficiency anemia due to intrinsic factor deficiency: Secondary | ICD-10-CM | POA: Diagnosis not present

## 2015-09-25 DIAGNOSIS — L400 Psoriasis vulgaris: Secondary | ICD-10-CM | POA: Diagnosis not present

## 2015-09-25 DIAGNOSIS — L820 Inflamed seborrheic keratosis: Secondary | ICD-10-CM | POA: Diagnosis not present

## 2015-09-25 DIAGNOSIS — L309 Dermatitis, unspecified: Secondary | ICD-10-CM | POA: Diagnosis not present

## 2015-09-26 DIAGNOSIS — R197 Diarrhea, unspecified: Secondary | ICD-10-CM | POA: Diagnosis not present

## 2015-09-26 DIAGNOSIS — L270 Generalized skin eruption due to drugs and medicaments taken internally: Secondary | ICD-10-CM | POA: Diagnosis not present

## 2015-12-24 DIAGNOSIS — I10 Essential (primary) hypertension: Secondary | ICD-10-CM | POA: Diagnosis not present

## 2015-12-25 DIAGNOSIS — L400 Psoriasis vulgaris: Secondary | ICD-10-CM | POA: Diagnosis not present

## 2015-12-25 DIAGNOSIS — L820 Inflamed seborrheic keratosis: Secondary | ICD-10-CM | POA: Diagnosis not present

## 2016-01-17 DIAGNOSIS — J40 Bronchitis, not specified as acute or chronic: Secondary | ICD-10-CM | POA: Diagnosis not present

## 2016-01-17 DIAGNOSIS — J4522 Mild intermittent asthma with status asthmaticus: Secondary | ICD-10-CM | POA: Diagnosis not present

## 2016-02-01 DIAGNOSIS — J209 Acute bronchitis, unspecified: Secondary | ICD-10-CM | POA: Diagnosis not present

## 2016-03-04 DIAGNOSIS — Z125 Encounter for screening for malignant neoplasm of prostate: Secondary | ICD-10-CM | POA: Diagnosis not present

## 2016-03-04 DIAGNOSIS — D510 Vitamin B12 deficiency anemia due to intrinsic factor deficiency: Secondary | ICD-10-CM | POA: Diagnosis not present

## 2016-03-04 DIAGNOSIS — L409 Psoriasis, unspecified: Secondary | ICD-10-CM | POA: Diagnosis not present

## 2016-03-04 DIAGNOSIS — E039 Hypothyroidism, unspecified: Secondary | ICD-10-CM | POA: Diagnosis not present

## 2016-03-15 DIAGNOSIS — Z0000 Encounter for general adult medical examination without abnormal findings: Secondary | ICD-10-CM | POA: Diagnosis not present

## 2016-05-21 DIAGNOSIS — J0100 Acute maxillary sinusitis, unspecified: Secondary | ICD-10-CM | POA: Diagnosis not present

## 2016-05-21 DIAGNOSIS — M5116 Intervertebral disc disorders with radiculopathy, lumbar region: Secondary | ICD-10-CM | POA: Diagnosis not present

## 2016-07-04 DIAGNOSIS — L821 Other seborrheic keratosis: Secondary | ICD-10-CM | POA: Diagnosis not present

## 2016-07-04 DIAGNOSIS — D1800 Hemangioma unspecified site: Secondary | ICD-10-CM | POA: Diagnosis not present

## 2016-07-04 DIAGNOSIS — Z79899 Other long term (current) drug therapy: Secondary | ICD-10-CM | POA: Diagnosis not present

## 2016-07-04 DIAGNOSIS — L400 Psoriasis vulgaris: Secondary | ICD-10-CM | POA: Diagnosis not present

## 2016-09-06 DIAGNOSIS — E538 Deficiency of other specified B group vitamins: Secondary | ICD-10-CM | POA: Diagnosis not present

## 2016-09-06 DIAGNOSIS — I2510 Atherosclerotic heart disease of native coronary artery without angina pectoris: Secondary | ICD-10-CM | POA: Diagnosis not present

## 2016-09-06 DIAGNOSIS — Z0000 Encounter for general adult medical examination without abnormal findings: Secondary | ICD-10-CM | POA: Diagnosis not present

## 2016-09-13 DIAGNOSIS — E538 Deficiency of other specified B group vitamins: Secondary | ICD-10-CM | POA: Diagnosis not present

## 2016-09-13 DIAGNOSIS — I2510 Atherosclerotic heart disease of native coronary artery without angina pectoris: Secondary | ICD-10-CM | POA: Diagnosis not present

## 2016-10-29 DIAGNOSIS — R05 Cough: Secondary | ICD-10-CM | POA: Diagnosis not present

## 2016-10-29 DIAGNOSIS — J0100 Acute maxillary sinusitis, unspecified: Secondary | ICD-10-CM | POA: Diagnosis not present

## 2016-11-11 DIAGNOSIS — M0230 Reiter's disease, unspecified site: Secondary | ICD-10-CM | POA: Diagnosis not present

## 2016-11-11 DIAGNOSIS — B349 Viral infection, unspecified: Secondary | ICD-10-CM | POA: Diagnosis not present

## 2016-12-30 DIAGNOSIS — I10 Essential (primary) hypertension: Secondary | ICD-10-CM | POA: Diagnosis not present

## 2017-01-20 DIAGNOSIS — I951 Orthostatic hypotension: Secondary | ICD-10-CM | POA: Diagnosis not present

## 2017-01-20 DIAGNOSIS — J40 Bronchitis, not specified as acute or chronic: Secondary | ICD-10-CM | POA: Diagnosis not present

## 2017-03-27 DIAGNOSIS — E538 Deficiency of other specified B group vitamins: Secondary | ICD-10-CM | POA: Diagnosis not present

## 2017-03-27 DIAGNOSIS — E039 Hypothyroidism, unspecified: Secondary | ICD-10-CM | POA: Diagnosis not present

## 2017-03-27 DIAGNOSIS — Z125 Encounter for screening for malignant neoplasm of prostate: Secondary | ICD-10-CM | POA: Diagnosis not present

## 2017-03-27 DIAGNOSIS — I2510 Atherosclerotic heart disease of native coronary artery without angina pectoris: Secondary | ICD-10-CM | POA: Diagnosis not present

## 2017-04-02 DIAGNOSIS — Z0000 Encounter for general adult medical examination without abnormal findings: Secondary | ICD-10-CM | POA: Diagnosis not present

## 2017-04-02 DIAGNOSIS — E782 Mixed hyperlipidemia: Secondary | ICD-10-CM | POA: Diagnosis not present

## 2017-04-02 DIAGNOSIS — E538 Deficiency of other specified B group vitamins: Secondary | ICD-10-CM | POA: Diagnosis not present

## 2017-04-02 DIAGNOSIS — E039 Hypothyroidism, unspecified: Secondary | ICD-10-CM | POA: Diagnosis not present

## 2017-04-02 DIAGNOSIS — R079 Chest pain, unspecified: Secondary | ICD-10-CM | POA: Diagnosis not present

## 2017-04-02 DIAGNOSIS — R739 Hyperglycemia, unspecified: Secondary | ICD-10-CM | POA: Diagnosis not present

## 2017-04-03 DIAGNOSIS — I10 Essential (primary) hypertension: Secondary | ICD-10-CM | POA: Diagnosis not present

## 2017-04-03 DIAGNOSIS — I2510 Atherosclerotic heart disease of native coronary artery without angina pectoris: Secondary | ICD-10-CM | POA: Diagnosis not present

## 2017-04-03 DIAGNOSIS — E782 Mixed hyperlipidemia: Secondary | ICD-10-CM | POA: Diagnosis not present

## 2017-04-08 DIAGNOSIS — I2510 Atherosclerotic heart disease of native coronary artery without angina pectoris: Secondary | ICD-10-CM | POA: Diagnosis not present

## 2017-04-09 DIAGNOSIS — I2510 Atherosclerotic heart disease of native coronary artery without angina pectoris: Secondary | ICD-10-CM | POA: Diagnosis not present

## 2017-04-09 DIAGNOSIS — E782 Mixed hyperlipidemia: Secondary | ICD-10-CM | POA: Diagnosis not present

## 2017-04-09 DIAGNOSIS — I10 Essential (primary) hypertension: Secondary | ICD-10-CM | POA: Diagnosis not present

## 2017-04-18 ENCOUNTER — Other Ambulatory Visit: Payer: Self-pay | Admitting: Cardiology

## 2017-04-24 ENCOUNTER — Observation Stay
Admission: RE | Admit: 2017-04-24 | Discharge: 2017-04-25 | Disposition: A | Discharge status: Home / Self Care | Payer: Medicare HMO | Source: Ambulatory Visit | Attending: Cardiology | Admitting: Cardiology

## 2017-04-24 ENCOUNTER — Encounter
Admission: RE | Disposition: A | Discharge status: Home / Self Care | Payer: Self-pay | Source: Ambulatory Visit | Attending: Cardiology

## 2017-04-24 ENCOUNTER — Other Ambulatory Visit: Payer: Self-pay | Admitting: Cardiology

## 2017-04-24 DIAGNOSIS — I2582 Chronic total occlusion of coronary artery: Secondary | ICD-10-CM | POA: Diagnosis not present

## 2017-04-24 DIAGNOSIS — J45909 Unspecified asthma, uncomplicated: Secondary | ICD-10-CM | POA: Diagnosis not present

## 2017-04-24 DIAGNOSIS — M159 Polyosteoarthritis, unspecified: Secondary | ICD-10-CM | POA: Insufficient documentation

## 2017-04-24 DIAGNOSIS — L409 Psoriasis, unspecified: Secondary | ICD-10-CM | POA: Diagnosis not present

## 2017-04-24 DIAGNOSIS — E782 Mixed hyperlipidemia: Secondary | ICD-10-CM | POA: Insufficient documentation

## 2017-04-24 DIAGNOSIS — I10 Essential (primary) hypertension: Secondary | ICD-10-CM | POA: Diagnosis not present

## 2017-04-24 DIAGNOSIS — I255 Ischemic cardiomyopathy: Secondary | ICD-10-CM | POA: Diagnosis not present

## 2017-04-24 DIAGNOSIS — R079 Chest pain, unspecified: Secondary | ICD-10-CM | POA: Diagnosis present

## 2017-04-24 DIAGNOSIS — Z87891 Personal history of nicotine dependence: Secondary | ICD-10-CM | POA: Diagnosis not present

## 2017-04-24 DIAGNOSIS — I252 Old myocardial infarction: Secondary | ICD-10-CM | POA: Insufficient documentation

## 2017-04-24 DIAGNOSIS — I2510 Atherosclerotic heart disease of native coronary artery without angina pectoris: Secondary | ICD-10-CM | POA: Diagnosis present

## 2017-04-24 DIAGNOSIS — I25810 Atherosclerosis of coronary artery bypass graft(s) without angina pectoris: Secondary | ICD-10-CM | POA: Diagnosis not present

## 2017-04-24 DIAGNOSIS — E039 Hypothyroidism, unspecified: Secondary | ICD-10-CM | POA: Insufficient documentation

## 2017-04-24 DIAGNOSIS — Z8546 Personal history of malignant neoplasm of prostate: Secondary | ICD-10-CM | POA: Diagnosis not present

## 2017-04-24 DIAGNOSIS — Z7982 Long term (current) use of aspirin: Secondary | ICD-10-CM | POA: Insufficient documentation

## 2017-04-24 DIAGNOSIS — I25728 Atherosclerosis of autologous artery coronary artery bypass graft(s) with other forms of angina pectoris: Secondary | ICD-10-CM | POA: Diagnosis not present

## 2017-04-24 DIAGNOSIS — D519 Vitamin B12 deficiency anemia, unspecified: Secondary | ICD-10-CM | POA: Diagnosis not present

## 2017-04-24 DIAGNOSIS — I200 Unstable angina: Secondary | ICD-10-CM | POA: Diagnosis not present

## 2017-04-24 DIAGNOSIS — Z01810 Encounter for preprocedural cardiovascular examination: Secondary | ICD-10-CM | POA: Diagnosis not present

## 2017-04-24 HISTORY — PX: CORONARY STENT INTERVENTION: CATH118234

## 2017-04-24 HISTORY — PX: LEFT HEART CATH AND CORONARY ANGIOGRAPHY: CATH118249

## 2017-04-24 LAB — POCT ACTIVATED CLOTTING TIME: ACTIVATED CLOTTING TIME: 384 s

## 2017-04-24 SURGERY — LEFT HEART CATH AND CORONARY ANGIOGRAPHY
Anesthesia: Moderate Sedation

## 2017-04-24 MED ORDER — SODIUM CHLORIDE 0.9% FLUSH: 3.0000 mL | Freq: Two times a day (BID) | INTRAVENOUS | Status: DC

## 2017-04-24 MED ORDER — SODIUM CHLORIDE 0.9 % WEIGHT BASED INFUSION
3.0000 mL/kg/h | INTRAVENOUS | Status: DC
  Administered 2017-04-24: 3 mL/kg/h via INTRAVENOUS

## 2017-04-24 MED ORDER — AMLODIPINE BESYLATE 5 MG PO TABS
5.0000 mg | ORAL_TABLET | Freq: Every day | ORAL | Status: DC
  Administered 2017-04-25: 5 mg via ORAL
  Filled 2017-04-24: qty 1

## 2017-04-24 MED ORDER — BENAZEPRIL HCL 20 MG PO TABS
20.0000 mg | ORAL_TABLET | Freq: Every day | ORAL | Status: DC
  Administered 2017-04-25: 20 mg via ORAL
  Filled 2017-04-24 (×2): qty 1

## 2017-04-24 MED ORDER — ASPIRIN 81 MG PO CHEW
CHEWABLE_TABLET | ORAL | Status: AC
  Filled 2017-04-24: qty 4

## 2017-04-24 MED ORDER — MIDAZOLAM HCL 2 MG/2ML IJ SOLN
INTRAMUSCULAR | Status: DC | PRN
  Administered 2017-04-24: 1 mg via INTRAVENOUS

## 2017-04-24 MED ORDER — HYDRALAZINE HCL 20 MG/ML IJ SOLN: 5.0000 mg | INTRAMUSCULAR | Status: AC | PRN

## 2017-04-24 MED ORDER — NITROGLYCERIN 5 MG/ML IV SOLN
INTRAVENOUS | Status: AC
  Filled 2017-04-24: qty 10

## 2017-04-24 MED ORDER — MIDAZOLAM HCL 2 MG/2ML IJ SOLN
INTRAMUSCULAR | Status: DC | PRN
  Administered 2017-04-24: 0.5 mg via INTRAVENOUS

## 2017-04-24 MED ORDER — MIDAZOLAM HCL 2 MG/2ML IJ SOLN
INTRAMUSCULAR | Status: AC
  Filled 2017-04-24: qty 2

## 2017-04-24 MED ORDER — SODIUM CHLORIDE 0.9% FLUSH
3.0000 mL | Freq: Two times a day (BID) | INTRAVENOUS | Status: DC
  Administered 2017-04-25: 3 mL via INTRAVENOUS

## 2017-04-24 MED ORDER — FENTANYL CITRATE (PF) 100 MCG/2ML IJ SOLN
INTRAMUSCULAR | Status: AC
  Filled 2017-04-24: qty 2

## 2017-04-24 MED ORDER — SODIUM CHLORIDE 0.9% FLUSH: 3.0000 mL | INTRAVENOUS | Status: DC | PRN

## 2017-04-24 MED ORDER — ASPIRIN 81 MG PO CHEW
CHEWABLE_TABLET | ORAL | Status: DC | PRN
  Administered 2017-04-24: 324 mg via ORAL

## 2017-04-24 MED ORDER — SODIUM CHLORIDE 0.9 % WEIGHT BASED INFUSION: 1.0000 mL/kg/h | INTRAVENOUS | Status: DC

## 2017-04-24 MED ORDER — BIVALIRUDIN TRIFLUOROACETATE 250 MG IV SOLR
INTRAVENOUS | Status: AC
  Filled 2017-04-24: qty 250

## 2017-04-24 MED ORDER — ONDANSETRON HCL 4 MG/2ML IJ SOLN: 4.0000 mg | Freq: Four times a day (QID) | INTRAMUSCULAR | Status: DC | PRN

## 2017-04-24 MED ORDER — ASPIRIN 81 MG PO CHEW
81.0000 mg | CHEWABLE_TABLET | Freq: Every day | ORAL | Status: DC
  Administered 2017-04-25: 81 mg via ORAL
  Filled 2017-04-24: qty 1

## 2017-04-24 MED ORDER — IOPAMIDOL (ISOVUE-300) INJECTION 61%
INTRAVENOUS | Status: DC | PRN
  Administered 2017-04-24: 190 mL via INTRA_ARTERIAL

## 2017-04-24 MED ORDER — HYDROCHLOROTHIAZIDE 25 MG PO TABS
25.0000 mg | ORAL_TABLET | Freq: Every day | ORAL | Status: DC
  Administered 2017-04-25: 25 mg via ORAL
  Filled 2017-04-24 (×3): qty 1

## 2017-04-24 MED ORDER — LEVOTHYROXINE SODIUM 50 MCG PO TABS
75.0000 ug | ORAL_TABLET | Freq: Every day | ORAL | Status: DC
  Administered 2017-04-25: 08:00:00 75 ug via ORAL
  Filled 2017-04-24 (×2): qty 1

## 2017-04-24 MED ORDER — CARVEDILOL 12.5 MG PO TABS
12.5000 mg | ORAL_TABLET | Freq: Two times a day (BID) | ORAL | Status: DC
Start: 2017-04-24 — End: 2017-04-25
  Administered 2017-04-24 – 2017-04-25 (×2): 12.5 mg via ORAL
  Filled 2017-04-24 (×4): qty 1

## 2017-04-24 MED ORDER — CLOPIDOGREL BISULFATE 75 MG PO TABS
75.0000 mg | ORAL_TABLET | Freq: Every day | ORAL | Status: DC
  Administered 2017-04-25: 75 mg via ORAL
  Filled 2017-04-24: qty 1

## 2017-04-24 MED ORDER — FENTANYL CITRATE (PF) 100 MCG/2ML IJ SOLN
INTRAMUSCULAR | Status: DC | PRN
  Administered 2017-04-24: 50 ug via INTRAVENOUS

## 2017-04-24 MED ORDER — NITROGLYCERIN 1 MG/10 ML FOR IR/CATH LAB
INTRA_ARTERIAL | Status: DC | PRN
  Administered 2017-04-24: 200 ug via INTRACORONARY

## 2017-04-24 MED ORDER — CLOPIDOGREL BISULFATE 75 MG PO TABS
ORAL_TABLET | ORAL | Status: DC | PRN
  Administered 2017-04-24: 600 mg via ORAL

## 2017-04-24 MED ORDER — SODIUM CHLORIDE 0.9 % IV SOLN
INTRAVENOUS | Status: AC | PRN
  Administered 2017-04-24: 1.75 mg/kg/h via INTRAVENOUS

## 2017-04-24 MED ORDER — LIDOCAINE HCL (PF) 1 % IJ SOLN
INTRAMUSCULAR | Status: AC
  Filled 2017-04-24: qty 30

## 2017-04-24 MED ORDER — FENTANYL CITRATE (PF) 100 MCG/2ML IJ SOLN
INTRAMUSCULAR | Status: DC | PRN
  Administered 2017-04-24: 25 ug via INTRAVENOUS

## 2017-04-24 MED ORDER — SODIUM CHLORIDE 0.9 % WEIGHT BASED INFUSION
1.0000 mL/kg/h | INTRAVENOUS | Status: AC
  Administered 2017-04-24: 1 mL/kg/h via INTRAVENOUS

## 2017-04-24 MED ORDER — ALBUTEROL SULFATE (2.5 MG/3ML) 0.083% IN NEBU
3.0000 mL | INHALATION_SOLUTION | Freq: Four times a day (QID) | RESPIRATORY_TRACT | Status: DC | PRN
  Filled 2017-04-24: qty 3

## 2017-04-24 MED ORDER — BIVALIRUDIN BOLUS VIA INFUSION - CUPID
INTRAVENOUS | Status: DC | PRN
  Administered 2017-04-24: 76.2 mg via INTRAVENOUS

## 2017-04-24 MED ORDER — ASPIRIN 81 MG PO CHEW: 81.0000 mg | CHEWABLE_TABLET | ORAL | Status: DC

## 2017-04-24 MED ORDER — SODIUM CHLORIDE 0.9 % IV SOLN: 250.0000 mL | INTRAVENOUS | Status: DC | PRN

## 2017-04-24 MED ORDER — LABETALOL HCL 5 MG/ML IV SOLN: 10.0000 mg | INTRAVENOUS | Status: AC | PRN

## 2017-04-24 MED ORDER — CLOPIDOGREL BISULFATE 75 MG PO TABS
ORAL_TABLET | ORAL | Status: AC
  Filled 2017-04-24: qty 8

## 2017-04-24 MED ORDER — ACETAMINOPHEN 325 MG PO TABS: 650.0000 mg | ORAL_TABLET | ORAL | Status: DC | PRN

## 2017-04-24 MED ORDER — IOPAMIDOL (ISOVUE-300) INJECTION 61%
INTRAVENOUS | Status: DC | PRN
Start: 2017-04-24 — End: 2017-04-24
  Administered 2017-04-24: 165 mL via INTRA_ARTERIAL

## 2017-04-24 MED ORDER — HEPARIN (PORCINE) IN NACL 2-0.9 UNIT/ML-% IJ SOLN
INTRAMUSCULAR | Status: AC
  Filled 2017-04-24: qty 500

## 2017-04-24 MED ORDER — EZETIMIBE 10 MG PO TABS
10.0000 mg | ORAL_TABLET | Freq: Every day | ORAL | Status: DC
  Administered 2017-04-25: 10 mg via ORAL
  Filled 2017-04-24 (×3): qty 1

## 2017-04-24 SURGICAL SUPPLY — 19 items
BALLN TREK RX 2.5X12 (BALLOONS) ×4
BALLOON TREK RX 2.5X12 (BALLOONS) ×2 IMPLANT
CATH 5FR JL4 DIAGNOSTIC (CATHETERS) ×4 IMPLANT
CATH 5FR JR4 DIAGNOSTIC (CATHETERS) ×4 IMPLANT
CATH INFINITI 5 FR IM (CATHETERS) ×4 IMPLANT
CATH INFINITI 5 FR MPA2 (CATHETERS) ×4 IMPLANT
CATH INFINITI 5FR ANG PIGTAIL (CATHETERS) ×4 IMPLANT
CATH VISTA GUIDE 6FR XB3.5 (CATHETERS) ×4 IMPLANT
DEVICE CLOSURE MYNXGRIP 6/7F (Vascular Products) ×4 IMPLANT
DEVICE INFLAT 30 PLUS (MISCELLANEOUS) ×4 IMPLANT
GUIDEWIRE 3MM J TIP .035 145 (WIRE) ×8 IMPLANT
KIT MANI 3VAL PERCEP (MISCELLANEOUS) ×4 IMPLANT
NEEDLE PERC 18GX7CM (NEEDLE) ×4 IMPLANT
PACK CARDIAC CATH (CUSTOM PROCEDURE TRAY) ×4 IMPLANT
SHEATH AVANTI 5FR X 11CM (SHEATH) ×4 IMPLANT
SHEATH AVANTI 6FR X 11CM (SHEATH) ×4 IMPLANT
STENT XIENCE ALPINE RX 3.25X15 (Permanent Stent) ×4 IMPLANT
WIRE ASAHI PROWATER 180CM (WIRE) ×4 IMPLANT
WIRE EMERALD 3MM-J .035X260CM (WIRE) ×4 IMPLANT

## 2017-04-24 NOTE — Plan of Care (Signed)
Problem: Safety: Goal: Ability to remain free from injury will improve Outcome: Progressing Fall precautions in place, non skid socks when oob  Problem: Pain Managment: Goal: General experience of comfort will improve Outcome: Progressing No complaints of pain, prn medications  Problem: Cardiovascular: Goal: Ability to achieve and maintain adequate cardiovascular perfusion will improve Outcome: Progressing Vascular site level 0

## 2017-04-24 NOTE — H&P (Signed)
Chief Complaint: Chief Complaint  Patient presents with  . discuss stress test  still has pain that comes and goes with sob  Date of Service: 04/09/2017 Date of Birth: 07/03/1946 PCP: Rusty Aus, MD  History of Present Illness: Mr. Antonio Barnes is a 71 y.o.male patient who presents for a follow-up. Was last seen quite a few years ago. Patient has a history of coronary artery bypass grafting with a left internal mammary to the LAD a free right internal mammary to a marginal and a saphenous vein graft to the PDA. This was done in 1998 at [facility]. He has done fairly well since that time. He has been doing fairly well until recently began noting some chest discomfort. Patient had no exertional chest discomfort prior to his coronary bypass grafting as he presented with an acute myocardial infarction. He has midsternal pain with some radiation to his back. During his myocardial infarction he had similar midsternal pain although radiation to his right arm. Electrocardiogram reveals sinus rhythm with T-wave inversions in inferolateral leads. This is unchanged from previous electrocardiograms. Patient has no rest pain. Patient underwent a functional study which showed borderline inferior ischemia. He exercised almost 9 minutes in a Bruce protocol however. His symptoms have progressed and concern over possible progression of his coronary disease. Risk benefits of left heart cath were explained to the patient he agrees to proceed. Further recommendations after cath.  Past Medical and Surgical History  Past Medical History Past Medical History:  Diagnosis Date  . Asthma, unspecified  . B12 deficiency anemia  . CAD (coronary artery disease)  . Generalized OA 03/21/2014  . Hemorrhoids  . Hypertension  . Hypothyroidism, unspecified  . Ischemic cardiomyopathy  mild  . Myocardial infarction (CMS-HCC) 1998  . Prostate cancer (CMS-HCC) 2014  post brachytherapy  . Psoriasis, unspecified    Past Surgical History He has a past surgical history that includes Coronary artery bypass graft (1998); Appendectomy; and Hernia repair (Left).   Medications and Allergies  Current Medications  Current Outpatient Prescriptions  Medication Sig Dispense Refill  . adalimumab (HUMIRA PEN) 40 mg/0.8 mL pen injector kit  . amLODIPine-benazepril (LOTREL) 5-20 mg capsule Take 1 capsule by mouth once daily.  Marland Kitchen aspirin 81 MG EC tablet Take 81 mg by mouth once daily.  . carvedilol (COREG) 25 MG tablet Take 12.5 mg by mouth 2 (two) times daily with meals.   Marland Kitchen ezetimibe (ZETIA) 10 mg tablet TAKE ONE TABLET BY MOUTH AT BEDTIME 90 tablet 3  . hydroCHLOROthiazide (HYDRODIURIL) 25 MG tablet Take 1 tablet (25 mg total) by mouth once daily as needed (edema). 30 tablet 11  . levothyroxine (SYNTHROID, LEVOTHROID) 75 MCG tablet TAKE 1 TABLET EVERY DAY ON EMPTY STOMACHWITH A GLASS OF WATER AT LEAST 30-60 MINBEFORE BREAKFAST 90 tablet 1  . naproxen sodium (ALEVE) 220 MG tablet Take 220 mg by mouth 2 (two) times daily as needed.   . sildenafil (REVATIO) 20 mg tablet TAKE 2 TO 5 TABLETS EVERY DAY AS NEEDED 90 tablet 3  . VENTOLIN HFA 90 mcg/actuation inhaler INHALE 1 OR 2 PUFFS INTO THE LUNGS TWICEDAILY AS NEEDED 18 g 11   No current facility-administered medications for this visit.   Allergies: Levaquin [levofloxacin]  Social and Family History  Social History reports that he has quit smoking. He has a 37.50 pack-year smoking history. He has quit using smokeless tobacco. He reports that he drinks alcohol. He reports that he does not use drugs.  Family  History Family History  Problem Relation Age of Onset  . Pancreatic cancer Father  also tumor in his GI tract  . COPD Maternal Aunt  . Lung cancer Paternal Uncle   Review of Systems  Review of Systems  Constitutional: Negative for chills, diaphoresis, fever, malaise/fatigue and weight loss.  HENT: Negative for congestion, ear discharge, hearing loss  and tinnitus.  Eyes: Negative for blurred vision.  Respiratory: Negative for cough, hemoptysis, sputum production, shortness of breath and wheezing.  Cardiovascular: Positive for chest pain. Negative for palpitations, orthopnea, claudication, leg swelling and PND.  Gastrointestinal: Negative for abdominal pain, blood in stool, constipation, diarrhea, heartburn, melena, nausea and vomiting.  Genitourinary: Negative for dysuria, frequency, hematuria and urgency.  Musculoskeletal: Negative for back pain, falls, joint pain and myalgias.  Skin: Negative for itching and rash.  Neurological: Negative for dizziness, tingling, focal weakness, loss of consciousness, weakness and headaches.  Endo/Heme/Allergies: Negative for polydipsia. Does not bruise/bleed easily.  Psychiatric/Behavioral: Negative for depression, memory loss and substance abuse. The patient is not nervous/anxious.   Physical Examination   Vitals:BP 158/80  Pulse 76  Resp 12  Ht 188 cm ('6\' 2"' )  Wt (!) 102.1 kg (225 lb)  BMI 28.89 kg/m  Ht:188 cm ('6\' 2"' ) Wt:(!) 102.1 kg (225 lb) GBE:EFEO surface area is 2.31 meters squared. Body mass index is 28.89 kg/m.  Wt Readings from Last 3 Encounters:  04/09/17 (!) 102.1 kg (225 lb)  04/03/17 100.5 kg (221 lb 9.6 oz)  04/02/17 (!) 101.2 kg (223 lb)   BP Readings from Last 3 Encounters:  04/09/17 158/80  04/03/17 152/80  04/02/17 130/80   General appearance appears in no acute distress  Head Mouth and Eye exam Normocephalic, without obvious abnormality, atraumatic Dentition is good Eyes appear anicteric   Neck exam Thyroid: normal  Nodes: no obvious adenopathy  LUNGS Breath Sounds: Normal Percussion: Normal  CARDIOVASCULAR JVP CV wave: no HJR: no Elevation at 90 degrees: None Carotid Pulse: normal pulsation bilaterally Bruit: None Apex: apical impulse normal  Auscultation Rhythm: normal sinus rhythm S1: normal S2: normal Clicks: no Rub: no Murmurs: no  murmurs  Gallop: None ABDOMEN Liver enlargement: no Pulsatile aorta: no Ascites: no Bruits: no  EXTREMITIES Clubbing: no Edema: trace to 1+ bilateral pedal edema Pulses: peripheral pulses symmetrical Femoral Bruits: no Amputation: no SKIN Rash: no Cyanosis: no Embolic phemonenon: no Bruising: no NEURO Alert and Oriented to person, place and time: yes Non focal: yes  PSYCH: Pt appears to have normal affect  LABS REVIEWED Last 3 CBC results: Lab Results  Component Value Date  WBC 7.4 03/27/2017  WBC 7.4 09/06/2016  WBC 7.6 03/04/2016   Lab Results  Component Value Date  HGB 15.3 03/27/2017  HGB 14.7 09/06/2016  HGB 14.7 03/04/2016   Lab Results  Component Value Date  HCT 42.5 03/27/2017  HCT 41.4 09/06/2016  HCT 41.7 03/04/2016   Lab Results  Component Value Date  PLT 202 03/27/2017  PLT 273 09/06/2016  PLT 196 03/04/2016   Lab Results  Component Value Date  CREATININE 1.1 03/27/2017  BUN 14 03/27/2017  NA 137 03/27/2017  K 3.9 03/27/2017  CL 102 03/27/2017  CO2 25.8 03/27/2017   Lab Results  Component Value Date  HGBA1C 5.5 09/06/2016   Lab Results  Component Value Date  HDL 32.5 03/27/2017  HDL 35.0 09/06/2016  HDL 38.0 03/04/2016   Lab Results  Component Value Date  LDLCALC 62 03/27/2017  LDLCALC 77 09/06/2016  LDLCALC 70 03/04/2016  Lab Results  Component Value Date  TRIG 181 03/27/2017  TRIG 98 09/06/2016  TRIG 151 03/04/2016   Lab Results  Component Value Date  ALT 70 (H) 03/27/2017  AST 62 (H) 03/27/2017  ALKPHOS 59 03/27/2017   Lab Results  Component Value Date  TSH 8.158 (H) 03/27/2017   Diagnostic Studies Reviewed:  EKG EKG demonstrated normal sinus rhythm, nonspecific ST and T waves changes, ischemic changes noted in Lateral leads.  Assessment and Plan  71 y.o. male with  ICD-10-CM ICD-9-CM  1. Coronary artery disease involving native coronary artery of native heart without angina pectoris-patient with  coronary bypass grafting with exertional chest pain with features fairly typical of angina but somewhat different on occasion. Functional study showed evidence of inferior ischemia. Risks and benefits of left heart cath were explained to the patient. Plan to proceed left heart cath to evaluate coronary anatomy with further recommendations after this is complete. I25.10 414.01   2. HTN (hypertension), benign-continue with current regimen I10 401.1  3. Hyperlipidemia, mixed-low-fat low-cholesterol diet. Continue his Zetia. May need a statin. E78.2 272.2   Return in about 2 weeks (around 04/23/2017).  These notes generated with voice recognition software. I apologize for typographical errors.  Sydnee Levans, MD    Pt seen and examined. No change from above.

## 2017-04-24 NOTE — Progress Notes (Signed)
72 yo wm admitted to room 234 from [place] s/p DES to mid lt circ.  A&O x3, remains on bedrest till 1800.  No distress on ra. Cardiac monitor applied to pt and verified with Candace, CNA., pt denies chest pain at this time.  Dressing to rt groin dry and intact, pulses equal.  Pt on bedrest, flat until 1800, pt verbalizes understanding.  Pt oriented to room and surroundings. POC reviewed with pt and family.  Skin intact.  IVF infusing well lt wrist, SL lt ac flushes well.  Denies need at this time. CB in reach, SR up x 2.

## 2017-04-24 NOTE — Discharge Instructions (Signed)
Groin Insertion Instructions-If you lose feeling or develop tingling or pain in your leg or foot after the procedure, please walk around first.  If the discomfort does not improve , contact your physician and proceed to the nearest emergency room.  Loss of feeling in your leg might mean that a blockage has formed in the artery and this can be appropriately treated.  Limit your activity for the next two days after your procedure.  Avoid stooping, bending, heavy lifting or exertion as this may put pressure on the insertion site.  Resume normal activities in 48 hours.  You may shower after 24 hours but avoid excessive warm water and do not scrub the site.  Remove clear dressing in 48 hours.  If you have had a closure device inserted, do not soak in a tub bath or a hot tub for at least one week. ° °No driving for 48 hours after discharge.  After the procedure, check the insertion site occasionally.  If any oozing occurs or there is apparent swelling, firm pressure over the site will prevent a bruise from forming.  You can not hurt anything by pressing directly on the site.  The pressure stops the bleeding by allowing a small clot to form.  If the bleeding continues after the pressure has been applied for more than 15 minutes, call 911 or go to the nearest emergency room.   ° °The x-ray dye causes you to pass a considerate amount of urine.  For this reason, you will be asked to drink plenty of liquids after the procedure to prevent dehydration.  You may resume you regular diet.  Avoid caffeine products.   ° °For pain at the site of your procedure, take non-aspirin medicines such as Tylenol. ° °Medications: A. Hold Metformin for 48 hours if applicable.  B. Continue taking all your present medications at home unless your doctor prescribes any changes. ° °Moderate Conscious Sedation, Adult, Care After °These instructions provide you with information about caring for yourself after your procedure. Your health care provider  may also give you more specific instructions. Your treatment has been planned according to current medical practices, but problems sometimes occur. Call your health care provider if you have any problems or questions after your procedure. °What can I expect after the procedure? °After your procedure, it is common: °· To feel sleepy for several hours. °· To feel clumsy and have poor balance for several hours. °· To have poor judgment for several hours. °· To vomit if you eat too soon. ° °Follow these instructions at home: °For at least 24 hours after the procedure: ° °· Do not: °? Participate in activities where you could fall or become injured. °? Drive. °? Use heavy machinery. °? Drink alcohol. °? Take sleeping pills or medicines that cause drowsiness. °? Make important decisions or sign legal documents. °? Take care of children on your own. °· Rest. °Eating and drinking °· Follow the diet recommended by your health care provider. °· If you vomit: °? Drink water, juice, or soup when you can drink without vomiting. °? Make sure you have little or no nausea before eating solid foods. °General instructions °· Have a responsible adult stay with you until you are awake and alert. °· Take over-the-counter and prescription medicines only as told by your health care provider. °· If you smoke, do not smoke without supervision. °· Keep all follow-up visits as told by your health care provider. This is important. °Contact a health care provider   if: °· You keep feeling nauseous or you keep vomiting. °· You feel light-headed. °· You develop a rash. °· You have a fever. °Get help right away if: °· You have trouble breathing. °This information is not intended to replace advice given to you by your health care provider. Make sure you discuss any questions you have with your health care provider. °Document Released: 06/23/2013 Document Revised: 02/05/2016 Document Reviewed: 12/23/2015 °Elsevier Interactive Patient Education © 2018  [employer] Inc. ° °

## 2017-04-24 NOTE — Progress Notes (Signed)
Patient resting, clinically stable post heart cath with stent placement per Dr Saralyn Pilar, son at bedside. Dr Saralyn Pilar out to speak with patient and family with questions answered, denies complaints. Sinus rhythm per monitor. Report called to Particia Nearing RN  With plan reviewed. No bleeding nor hematoma at right groin site. Will discontinue angiomax gtt at 1550 as ordered.

## 2017-04-25 ENCOUNTER — Encounter: Payer: Self-pay | Admitting: Cardiology

## 2017-04-25 DIAGNOSIS — I255 Ischemic cardiomyopathy: Secondary | ICD-10-CM | POA: Diagnosis not present

## 2017-04-25 DIAGNOSIS — D519 Vitamin B12 deficiency anemia, unspecified: Secondary | ICD-10-CM | POA: Diagnosis not present

## 2017-04-25 DIAGNOSIS — I252 Old myocardial infarction: Secondary | ICD-10-CM | POA: Diagnosis not present

## 2017-04-25 DIAGNOSIS — M159 Polyosteoarthritis, unspecified: Secondary | ICD-10-CM | POA: Diagnosis not present

## 2017-04-25 DIAGNOSIS — E782 Mixed hyperlipidemia: Secondary | ICD-10-CM | POA: Diagnosis not present

## 2017-04-25 DIAGNOSIS — I25810 Atherosclerosis of coronary artery bypass graft(s) without angina pectoris: Secondary | ICD-10-CM | POA: Diagnosis not present

## 2017-04-25 DIAGNOSIS — J45909 Unspecified asthma, uncomplicated: Secondary | ICD-10-CM | POA: Diagnosis not present

## 2017-04-25 DIAGNOSIS — I2582 Chronic total occlusion of coronary artery: Secondary | ICD-10-CM | POA: Diagnosis not present

## 2017-04-25 DIAGNOSIS — I10 Essential (primary) hypertension: Secondary | ICD-10-CM | POA: Diagnosis not present

## 2017-04-25 DIAGNOSIS — I200 Unstable angina: Secondary | ICD-10-CM | POA: Diagnosis not present

## 2017-04-25 LAB — BASIC METABOLIC PANEL
ANION GAP: 7 (ref 5–15)
BUN: 20 mg/dL (ref 6–20)
CO2: 25 mmol/L (ref 22–32)
Calcium: 9.1 mg/dL (ref 8.9–10.3)
Chloride: 105 mmol/L (ref 101–111)
Creatinine, Ser: 0.91 mg/dL (ref 0.61–1.24)
GFR calc Af Amer: >60 mL/min (ref >60)
GFR calc non Af Amer: >60 mL/min (ref >60)
GLUCOSE: 145 mg/dL — AB (ref 65–99)
Potassium: 3.4 mmol/L — ABNORMAL LOW (ref 3.5–5.1)
Sodium: 137 mmol/L (ref 135–145)

## 2017-04-25 LAB — CBC
HEMATOCRIT: 40.5 % (ref 40.0–52.0)
HEMOGLOBIN: 14.2 g/dL (ref 13.0–18.0)
MCH: 29.7 pg (ref 26.0–34.0)
MCHC: 35.2 g/dL (ref 32.0–36.0)
MCV: 84.3 fL (ref 80.0–100.0)
PLATELETS: 188 10*3/uL (ref 150–440)
RBC: 4.8 MIL/uL (ref 4.40–5.90)
RDW: 14 % (ref 11.5–14.5)
WBC: 6.9 10*3/uL (ref 3.8–10.6)

## 2017-04-25 MED ORDER — CLOPIDOGREL BISULFATE 75 MG PO TABS: 75.0000 mg | ORAL_TABLET | Freq: Every day | ORAL | 11 refills | Status: DC

## 2017-04-25 NOTE — Discharge Summary (Addendum)
Physician Discharge Summary  Patient ID: Antonio Barnes MRN: [id number] DOB/AGE: 1946/05/11 71 y.o.  Admit date: 04/24/2017 Discharge date: 04/25/2017  Admission Diagnoses:  Discharge Diagnoses:  Active Problems:   Chest pain on exertion   Discharged Condition: good  Hospital Course: Patient with history of coronary artery disease status post coronary artery bypass grafting in 1998 who presented for outpatient left cardiac catheterization after developing exertional chest pain and ischemia on a functional study. Cardiac catheterization revealed three-vessel coronary disease with a patent left internal mammary to the LAD, a moderately stenosed small free right internal mammary to the OM1 with a patulous but patent saphenous fraying graft to the PDA. He had a 95% stenosis in the mid left circumflex. He was referred for PCI of this which was successfully completed. He was monitored postprocedure in observation and had no further chest pain or shortness of breath. Catheter site is clean and dry. There is no hematoma or bruit. Distal pulses are intact.Patient is statin intolerant and remains on Zetia.  Consults: For PCI  Significant Diagnostic Studies: angiography: Left cardiac catheterization  Treatments: IV hydration  Discharge Exam: Blood pressure (!) 171/84, pulse (!) 55, temperature 97.8 F (36.6 C), temperature source Oral, resp. rate 16, height 6\' 2"  (1.88 m), weight 101.6 kg (224 lb), SpO2 95 %. General appearance: alert and cooperative Head: Normocephalic, without obvious abnormality, atraumatic Resp: clear to auscultation bilaterally Chest wall: no tenderness Cardio: regular rate and rhythm GI: soft, non-tender; bowel sounds normal; no masses,  no organomegaly Extremities: extremities normal, atraumatic, no cyanosis or edema Pulses: 2+ and symmetric Neurologic: Grossly normal  Disposition: 01-Home or Self Care  Discharge Instructions    AMB Referral to Cardiac Rehabilitation  - Phase II    Complete by:  As directed    Diagnosis:  Coronary Stents     Allergies as of 04/25/2017   No Known Allergies     Medication List    TAKE these medications   albuterol 108 (90 Base) MCG/ACT inhaler Commonly known as:  PROVENTIL HFA;VENTOLIN HFA Inhale 2 puffs into the lungs every 6 (six) hours as needed for wheezing or shortness of breath.   amLODipine-benazepril 5-20 MG capsule Commonly known as:  LOTREL Take 1 capsule by mouth daily.   aspirin EC 81 MG tablet Take 81 mg by mouth daily.   betamethasone dipropionate 0.05 % cream Commonly known as:  DIPROLENE Apply 1 application topically daily as needed (psoriasis).   carvedilol 25 MG tablet Commonly known as:  COREG Take 25 mg by mouth daily.   clopidogrel 75 MG tablet Commonly known as:  PLAVIX Take 1 tablet (75 mg total) by mouth daily with breakfast.   ezetimibe 10 MG tablet Commonly known as:  ZETIA Take 10 mg by mouth daily.   HUMIRA 40 MG/0.8ML Pskt Generic drug:  Adalimumab Inject 40 mg into the skin. Every 2 weeks as needed for psoriasis   hydrochlorothiazide 25 MG tablet Commonly known as:  HYDRODIURIL Take 25 mg by mouth daily.   levothyroxine 75 MCG tablet Commonly known as:  SYNTHROID, LEVOTHROID Take 75 mcg by mouth daily before breakfast.   naproxen sodium 220 MG tablet Commonly known as:  ANAPROX Take 440 mg by mouth daily as needed (pain).   sildenafil 20 MG tablet Commonly known as:  REVATIO Take 40-100 mg by mouth daily as needed (erectile dysfunction).   WAL-FEX 180 MG tablet Generic drug:  fexofenadine Take 180 mg by mouth daily as needed for allergies or rhinitis.  Follow-up Information    Teodoro Spray, MD Follow up in 1 week(s).   Specialty:  Cardiology Contact information: [place] [address] [phone]           Signed: Teodoro Spray 04/25/2017, 7:39 AM

## 2017-04-25 NOTE — Consult Note (Addendum)
9:50 AM - 10:20 a.m.    Cardiac Rehab Referral ordered for patient by Dr. Saralyn Pilar with dx of Coronary Stents.  S/P stent to mid left circumflex on 04/24/2017.   Dr. Ubaldo Glassing is patient's cardiologist.  Patient S/P CABG in 1998.  Nurse in the room preparing to discharge patient.  Inquired as to patient's activity level at home.  He reported jokingly that he walks from kitchen to recliner and he attends a lot of ball games and events, as he has seven grandchildren.  Detailed explanation of Cardiac Rehab shared with patient.  Patient's daughter present during conversation.  Reviewed patient's insurance coverage and encouraged patient to verify coverage for Cardiac Rehab.  Explained it has been our experience with his carrier that his co-pay would be $10 per session.  Orientation materials/questionnaires given to patient to complete prior to his orientation appointment, which is scheduled for Monday, May 05, 2017 at 10:30 a.m.     Roanna Epley, RN, BSN, [facility] Cardiovascular and Pulmonary Nurse Navigator

## 2017-04-25 NOTE — Final Progress Note (Signed)
Physician Final Progress Note  Patient ID: Antonio Barnes MRN: [id number] DOB/AGE: [birth year] 71 y.o.  Admit date: 04/24/2017 Admitting provider: Teodoro Spray, MD Discharge date: 04/25/2017   Admission Diagnoses: Coronary artery disease  Discharge Diagnoses:  Active Problems:   Chest pain on exertion  Abnormal functional study  Consults: cardiology  Significant Findings/ Diagnostic Studies: angiography: Left cardiac catheterization  Procedures: PCI of mid circumflex with a drug-eluting stent  Discharge Condition: good  Disposition: 01-Home or Self Care  Diet: Cardiac diet  Discharge Activity: No lifting, driving, or strenuous exercise for 3 days  Discharge Instructions    AMB Referral to Cardiac Rehabilitation - Phase II    Complete by:  As directed    Diagnosis:  Coronary Stents     Allergies as of 04/25/2017   No Known Allergies     Medication List    TAKE these medications   albuterol 108 (90 Base) MCG/ACT inhaler Commonly known as:  PROVENTIL HFA;VENTOLIN HFA Inhale 2 puffs into the lungs every 6 (six) hours as needed for wheezing or shortness of breath.   amLODipine-benazepril 5-20 MG capsule Commonly known as:  LOTREL Take 1 capsule by mouth daily.   aspirin EC 81 MG tablet Take 81 mg by mouth daily.   betamethasone dipropionate 0.05 % cream Commonly known as:  DIPROLENE Apply 1 application topically daily as needed (psoriasis).   carvedilol 25 MG tablet Commonly known as:  COREG Take 25 mg by mouth daily.   clopidogrel 75 MG tablet Commonly known as:  PLAVIX Take 1 tablet (75 mg total) by mouth daily with breakfast.   ezetimibe 10 MG tablet Commonly known as:  ZETIA Take 10 mg by mouth daily.   HUMIRA 40 MG/0.8ML Pskt Generic drug:  Adalimumab Inject 40 mg into the skin. Every 2 weeks as needed for psoriasis   hydrochlorothiazide 25 MG tablet Commonly known as:  HYDRODIURIL Take 25 mg by mouth daily.   levothyroxine 75 MCG  tablet Commonly known as:  SYNTHROID, LEVOTHROID Take 75 mcg by mouth daily before breakfast.   naproxen sodium 220 MG tablet Commonly known as:  ANAPROX Take 440 mg by mouth daily as needed (pain).   sildenafil 20 MG tablet Commonly known as:  REVATIO Take 40-100 mg by mouth daily as needed (erectile dysfunction).   WAL-FEX 180 MG tablet Generic drug:  fexofenadine Take 180 mg by mouth daily as needed for allergies or rhinitis.      Follow-up Information    Teodoro Spray, MD Follow up in 1 week(s).   Specialty:  Cardiology Contact information: [place] [address] [phone]           Total time spent taking care of this patient: 30 minutes  Signed: Teodoro Spray 04/25/2017, 7:42 AM

## 2017-04-25 NOTE — Care Management Obs Status (Signed)
[facility] NOTIFICATION   Patient Details  Name: Antonio Barnes MRN: [id number] Date of Birth: July 19, 1946   Medicare Observation Status Notification Given:  No < 24 hours- outpatient in bed procedure   Katrina Stack, RN 04/25/2017, 8:40 AM

## 2017-04-25 NOTE — Progress Notes (Signed)
Discharge instructions along with home medication, follow up and vascular discharge instructions gone over with patient and daughter, both verbalized that they understood instructions. No stent card in chart to give patient. Iv removed x2, telemetry removed. Dressing to right groin intact, no bleeding noted. No c/o pain. Patient to be discharged home. One printed rx given to patient

## 2017-05-02 DIAGNOSIS — Z9861 Coronary angioplasty status: Secondary | ICD-10-CM | POA: Diagnosis not present

## 2017-05-02 DIAGNOSIS — I2510 Atherosclerotic heart disease of native coronary artery without angina pectoris: Secondary | ICD-10-CM | POA: Diagnosis not present

## 2017-05-05 ENCOUNTER — Encounter: Payer: Medicare HMO | Attending: Cardiology | Admitting: *Deleted

## 2017-05-05 ENCOUNTER — Encounter: Payer: Self-pay | Admitting: *Deleted

## 2017-05-05 VITALS — Ht 74.0 in | Wt 222.8 lb

## 2017-05-05 DIAGNOSIS — Z955 Presence of coronary angioplasty implant and graft: Secondary | ICD-10-CM

## 2017-05-05 DIAGNOSIS — Z48812 Encounter for surgical aftercare following surgery on the circulatory system: Secondary | ICD-10-CM | POA: Diagnosis not present

## 2017-05-05 NOTE — Progress Notes (Signed)
Cardiac Individual Treatment Plan  Patient Details  Name: Antonio Barnes MRN: [id number] Date of Birth: [birth year] Referring Provider:     Cardiac Rehab from 05/05/2017 in [facility] Cardiac and Pulmonary Rehab  Referring Provider  Bartholome Bill MD      Initial Encounter Date:    Cardiac Rehab from 05/05/2017 in [facility] Cardiac and Pulmonary Rehab  Date  05/05/17  Referring Provider  Bartholome Bill MD      Visit Diagnosis: Status post coronary artery stent placement  Patient's Home Medications on Admission:  Current Outpatient Prescriptions:  .  Adalimumab (HUMIRA) 40 MG/0.8ML PSKT, Inject 40 mg into the skin. Every 2 weeks as needed for psoriasis, Disp: , Rfl:  .  albuterol (PROVENTIL HFA;VENTOLIN HFA) 108 (90 Base) MCG/ACT inhaler, Inhale 2 puffs into the lungs every 6 (six) hours as needed for wheezing or shortness of breath., Disp: , Rfl:  .  amLODipine-benazepril (LOTREL) 5-20 MG per capsule, Take 1 capsule by mouth daily., Disp: , Rfl:  .  aspirin EC 81 MG tablet, Take 81 mg by mouth daily., Disp: , Rfl:  .  betamethasone dipropionate (DIPROLENE) 0.05 % cream, Apply 1 application topically daily as needed (psoriasis). , Disp: , Rfl:  .  carvedilol (COREG) 25 MG tablet, Take 12.5 mg by mouth 2 (two) times daily with a meal. , Disp: , Rfl:  .  clopidogrel (PLAVIX) 75 MG tablet, Take 1 tablet (75 mg total) by mouth daily with breakfast., Disp: 30 tablet, Rfl: 11 .  ezetimibe (ZETIA) 10 MG tablet, Take 10 mg by mouth daily., Disp: , Rfl:  .  fexofenadine (WAL-FEX) 180 MG tablet, Take 180 mg by mouth daily as needed for allergies or rhinitis., Disp: , Rfl:  .  hydrochlorothiazide (HYDRODIURIL) 25 MG tablet, Take 25 mg by mouth daily., Disp: , Rfl:  .  levothyroxine (SYNTHROID, LEVOTHROID) 75 MCG tablet, Take 75 mcg by mouth daily before breakfast., Disp: , Rfl:  .  naproxen sodium (ANAPROX) 220 MG tablet, Take 440 mg by mouth daily as needed (pain). , Disp: , Rfl:  .  sildenafil (REVATIO) 20 MG  tablet, Take 40-100 mg by mouth daily as needed (erectile dysfunction). , Disp: , Rfl:   Past Medical History: Past Medical History:  Diagnosis Date  . Asthma   . Diabetes mellitus without complication ([place])   . Hypertension   . Myocardial infarct ([place])   . Prostate cancer ([place])     Tobacco Use: History  Smoking Status  . Former Smoker  Smokeless Tobacco  . Never Used    Labs: Recent Review Flowsheet Data    There is no flowsheet data to display.       Exercise Target Goals: Date: 05/05/17  Exercise Program Goal: Individual exercise prescription set with THRR, safety & activity barriers. Participant demonstrates ability to understand and report RPE using BORG scale, to self-measure pulse accurately, and to acknowledge the importance of the exercise prescription.  Exercise Prescription Goal: Starting with aerobic activity 30 plus minutes a day, 3 days per week for initial exercise prescription. Provide home exercise prescription and guidelines that participant acknowledges understanding prior to discharge.  Activity Barriers & Risk Stratification:     Activity Barriers & Cardiac Risk Stratification - 05/05/17 1343      Activity Barriers & Cardiac Risk Stratification   Activity Barriers Joint Problems;Deconditioning;Muscular Weakness;Shortness of Breath  Right hip  - standing or moving can cause pain in hip and down his leg. Has had cortisone shots that help  alleviate the pain, possible  [employer]   Cardiac Risk Stratification High      6 Minute Walk:     6 Minute Walk    Row Name 05/05/17 1411         6 Minute Walk   Phase Initial     Distance 1560 feet     Walk Time 6 minutes     # of Rest Breaks 0     MPH 2.95     METS 3.57     RPE 12     Perceived Dyspnea  2     VO2 Peak 12.5     Symptoms Yes (comment)     Comments SOB     Resting HR 53 bpm     Resting BP 103/74     Max Ex. HR 108 bpm     Max Ex. BP 132/64     2 Minute Post BP 122/60         Oxygen Initial Assessment:   Oxygen Re-Evaluation:   Oxygen Discharge (Final Oxygen Re-Evaluation):   Initial Exercise Prescription:     Initial Exercise Prescription - 05/05/17 1400      Date of Initial Exercise RX and Referring Provider   Date 05/05/17   Referring Provider Bartholome Bill MD     Treadmill   MPH 2.9   Grade 0.5   Minutes 15   METs 3.42     Recumbant Bike   Level 3   RPM 35   Watts 50   Minutes 15   METs 3.5     T5 Nustep   Level 3   SPM 80   Minutes 15   METs 3     Prescription Details   Frequency (times per week) 3   Duration Progress to 45 minutes of aerobic exercise without signs/symptoms of physical distress     Intensity   THRR 40-80% of Max Heartrate 93-132   Ratings of Perceived Exertion 11-13   Perceived Dyspnea 0-4     Progression   Progression Continue to progress workloads to maintain intensity without signs/symptoms of physical distress.     Resistance Training   Training Prescription Yes   Weight 4 lbs   Reps 10-15      Perform Capillary Blood Glucose checks as needed.  Exercise Prescription Changes:     Exercise Prescription Changes    Row Name 05/05/17 1300             Response to Exercise   Blood Pressure (Admit) 130/74       Blood Pressure (Exercise) 132/64       Blood Pressure (Exit) 122/60       Heart Rate (Admit) 53 bpm       Heart Rate (Exercise) 108 bpm       Heart Rate (Exit) 68 bpm       Oxygen Saturation (Admit) 99 %       Oxygen Saturation (Exercise) 99 %       Rating of Perceived Exertion (Exercise) 12       Perceived Dyspnea (Exercise) 2       Symptoms SOB       Comments walk test results          Exercise Comments:   Exercise Goals and Review:     Exercise Goals    Row Name 05/05/17 1415             Exercise Goals   Increase Physical Activity Yes  Intervention Provide advice, education, support and counseling about physical activity/exercise needs.;Develop an  individualized exercise prescription for aerobic and resistive training based on initial evaluation findings, risk stratification, comorbidities and participant's personal goals.       Expected Outcomes Achievement of increased cardiorespiratory fitness and enhanced flexibility, muscular endurance and strength shown through measurements of functional capacity and personal statement of participant.       Increase Strength and Stamina Yes       Intervention Provide advice, education, support and counseling about physical activity/exercise needs.;Develop an individualized exercise prescription for aerobic and resistive training based on initial evaluation findings, risk stratification, comorbidities and participant's personal goals.       Expected Outcomes Achievement of increased cardiorespiratory fitness and enhanced flexibility, muscular endurance and strength shown through measurements of functional capacity and personal statement of participant.          Exercise Goals Re-Evaluation :   Discharge Exercise Prescription (Final Exercise Prescription Changes):     Exercise Prescription Changes - 05/05/17 1300      Response to Exercise   Blood Pressure (Admit) 130/74   Blood Pressure (Exercise) 132/64   Blood Pressure (Exit) 122/60   Heart Rate (Admit) 53 bpm   Heart Rate (Exercise) 108 bpm   Heart Rate (Exit) 68 bpm   Oxygen Saturation (Admit) 99 %   Oxygen Saturation (Exercise) 99 %   Rating of Perceived Exertion (Exercise) 12   Perceived Dyspnea (Exercise) 2   Symptoms SOB   Comments walk test results      Nutrition:  Target Goals: Understanding of nutrition guidelines, daily intake of sodium 1500mg , cholesterol 200mg , calories 30% from fat and 7% or less from saturated fats, daily to have 5 or more servings of fruits and vegetables.  Biometrics:     Pre Biometrics - 05/05/17 1416      Pre Biometrics   Height 6\' 2"  (1.88 m)   Weight 222 lb 12.8 oz (101.1 kg)   Waist  Circumference 44.5 inches   Hip Circumference 39.5 inches   Waist to Hip Ratio 1.13 %   BMI (Calculated) 28.7   Single Leg Stand 20.07 seconds       Nutrition Therapy Plan and Nutrition Goals:     Nutrition Therapy & Goals - 05/05/17 1346      Intervention Plan   Intervention Prescribe, educate and counsel regarding individualized specific dietary modifications aiming towards targeted core components such as weight, hypertension, lipid management, diabetes, heart failure and other comorbidities.   Expected Outcomes Short Term Goal: Understand basic principles of dietary content, such as calories, fat, sodium, cholesterol and nutrients.;Short Term Goal: A plan has been developed with personal nutrition goals set during dietitian appointment.;Long Term Goal: Adherence to prescribed nutrition plan.      Nutrition Discharge: Rate Your Plate Scores:     Nutrition Assessments - 05/05/17 1346      MEDFICTS Scores   Pre Score 140      Nutrition Goals Re-Evaluation:   Nutrition Goals Discharge (Final Nutrition Goals Re-Evaluation):   Psychosocial: Target Goals: Acknowledge presence or absence of significant depression and/or stress, maximize coping skills, provide positive support system. Participant is able to verbalize types and ability to use techniques and skills needed for reducing stress and depression.   Initial Review & Psychosocial Screening:     Initial Psych Review & Screening - 05/05/17 1413      Initial Review   Current issues with None Identified     Family Dynamics  Good Support System? Yes  children        Barriers   Psychosocial barriers to participate in program There are no identifiable barriers or psychosocial needs.;The patient should benefit from training in stress management and relaxation.     Screening Interventions   Interventions Encouraged to exercise;Provide feedback about the scores to participant;To provide support and resources with  identified psychosocial needs      Quality of Life Scores:      Quality of Life - 05/05/17 1415      Quality of Life Scores   Health/Function Pre 23.23 %   Socioeconomic Pre 26.79 %   Psych/Spiritual Pre 23.64 %   Family Pre 26.9 %   GLOBAL Pre 24.59 %      PHQ-9: Recent Review Flowsheet Data    Depression screen [facility] 2/9 05/05/2017   Decreased Interest 1   Down, Depressed, Hopeless 0   PHQ - 2 Score 1   Altered sleeping 1   Tired, decreased energy 1   Change in appetite 2   Feeling bad or failure about yourself  0   Trouble concentrating 0   Moving slowly or fidgety/restless 0   Suicidal thoughts 0   PHQ-9 Score 5   Difficult doing work/chores Not difficult at all     Interpretation of Total Score  Total Score Depression Severity:  1-4 = Minimal depression, 5-9 = Mild depression, 10-14 = Moderate depression, 15-19 = Moderately severe depression, 20-27 = Severe depression   Psychosocial Evaluation and Intervention:   Psychosocial Re-Evaluation:   Psychosocial Discharge (Final Psychosocial Re-Evaluation):   Vocational Rehabilitation: Provide vocational rehab assistance to qualifying candidates.   Vocational Rehab Evaluation & Intervention:     Vocational Rehab - 05/05/17 1420      Initial Vocational Rehab Evaluation & Intervention   Assessment shows need for Vocational Rehabilitation No      Education: Education Goals: Education classes will be provided on a weekly basis, covering required topics. Participant will state understanding/return demonstration of topics presented.  Learning Barriers/Preferences:     Learning Barriers/Preferences - 05/05/17 1417      Learning Barriers/Preferences   Learning Barriers Hearing  Does not have hearing aids   Learning Preferences None      Education Topics: General Nutrition Guidelines/Fats and Fiber: -Group instruction provided by verbal, written material, models and posters to present the general  guidelines for heart healthy nutrition. Gives an explanation and review of dietary fats and fiber.   Controlling Sodium/Reading Food Labels: -Group verbal and written material supporting the discussion of sodium use in heart healthy nutrition. Review and explanation with models, verbal and written materials for utilization of the food label.   Exercise Physiology & Risk Factors: - Group verbal and written instruction with models to review the exercise physiology of the cardiovascular system and associated critical values. Details cardiovascular disease risk factors and the goals associated with each risk factor.   Aerobic Exercise & Resistance Training: - Gives group verbal and written discussion on the health impact of inactivity. On the components of aerobic and resistive training programs and the benefits of this training and how to safely progress through these programs.   Flexibility, Balance, General Exercise Guidelines: - Provides group verbal and written instruction on the benefits of flexibility and balance training programs. Provides general exercise guidelines with specific guidelines to those with heart or lung disease. Demonstration and skill practice provided.   Stress Management: - Provides group verbal and written instruction about the health  risks of elevated stress, cause of high stress, and healthy ways to reduce stress.   Depression: - Provides group verbal and written instruction on the correlation between heart/lung disease and depressed mood, treatment options, and the stigmas associated with seeking treatment.   Anatomy & Physiology of the Heart: - Group verbal and written instruction and models provide basic cardiac anatomy and physiology, with the coronary electrical and arterial systems. Review of: AMI, Angina, Valve disease, Heart Failure, Cardiac Arrhythmia, Pacemakers, and the ICD.   Cardiac Procedures: - Group verbal and written instruction and models to  describe the testing methods done to diagnose heart disease. Reviews the outcomes of the test results. Describes the treatment choices: Medical Management, Angioplasty, or Coronary Bypass Surgery.   Cardiac Medications: - Group verbal and written instruction to review commonly prescribed medications for heart disease. Reviews the medication, class of the drug, and side effects. Includes the steps to properly store meds and maintain the prescription regimen.   Go Sex-Intimacy & Heart Disease, Get SMART - Goal Setting: - Group verbal and written instruction through game format to discuss heart disease and the return to sexual intimacy. Provides group verbal and written material to discuss and apply goal setting through the application of the S.M.A.R.T. Method.   Other Matters of the Heart: - Provides group verbal, written materials and models to describe Heart Failure, Angina, Valve Disease, and Diabetes in the realm of heart disease. Includes description of the disease process and treatment options available to the cardiac patient.   Exercise & Equipment Safety: - Individual verbal instruction and demonstration of equipment use and safety with use of the equipment.   Cardiac Rehab from 05/05/2017 in [facility] Cardiac and Pulmonary Rehab  Date  05/05/17  Educator  SB  Instruction Review Code  2- meets goals/outcomes      Infection Prevention: - Provides verbal and written material to individual with discussion of infection control including proper hand washing and proper equipment cleaning during exercise session.   Cardiac Rehab from 05/05/2017 in [facility] Cardiac and Pulmonary Rehab  Date  05/05/17  Educator  SB  Instruction Review Code  2- meets goals/outcomes      Falls Prevention: - Provides verbal and written material to individual with discussion of falls prevention and safety.   Cardiac Rehab from 05/05/2017 in [facility] Cardiac and Pulmonary Rehab  Date  05/05/17  Educator  SB   Instruction Review Code  2- meets goals/outcomes      Diabetes: - Individual verbal and written instruction to review signs/symptoms of diabetes, desired ranges of glucose level fasting, after meals and with exercise. Advice that pre and post exercise glucose checks will be done for 3 sessions at entry of program.    Knowledge Questionnaire Score:     Knowledge Questionnaire Score - 05/05/17 1417      Knowledge Questionnaire Score   Pre Score 16/28  Reviewed corrct responses with Ed today.  He verbalized understanding of the correct responses      Core Components/Risk Factors/Patient Goals at Admission:     Personal Goals and Risk Factors at Admission - 05/05/17 1410      Core Components/Risk Factors/Patient Goals on Admission    Weight Management Yes;Obesity   Intervention Weight Management: Develop a combined nutrition and exercise program designed to reach desired caloric intake, while maintaining appropriate intake of nutrient and fiber, sodium and fats, and appropriate energy expenditure required for the weight goal.;Weight Management: Provide education and appropriate resources to help participant work  on and attain dietary goals.;Weight Management/Obesity: Establish reasonable short term and long term weight goals.;Obesity: Provide education and appropriate resources to help participant work on and attain dietary goals.   Admit Weight 222 lb 12.8 oz (101.1 kg)   Goal Weight: Short Term 220 lb (99.8 kg)   Goal Weight: Long Term 200 lb (90.7 kg)   Expected Outcomes Short Term: Continue to assess and modify interventions until short term weight is achieved;Long Term: Adherence to nutrition and physical activity/exercise program aimed toward attainment of established weight goal;Weight Loss: Understanding of general recommendations for a balanced deficit meal plan, which promotes 1-2 lb weight loss per week and includes a negative energy balance of [phone] kcal/d   Tobacco  Cessation Yes  Quit 4 months ago   Number of packs per day 1   Intervention Assist the participant in steps to quit. Provide individualized education and counseling about committing to Tobacco Cessation, relapse prevention, and pharmacological support that can be provided by physician.;Advice worker, assist with locating and accessing local/national Quit Smoking programs, and support quit date choice.   Expected Outcomes Short Term: Will demonstrate readiness to quit, by selecting a quit date.;Long Term: Complete abstinence from all tobacco products for at least 12 months from quit date.;Short Term: Will quit all tobacco product use, adhering to prevention of relapse plan.   Hypertension Yes   Intervention Provide education on lifestyle modifcations including regular physical activity/exercise, weight management, moderate sodium restriction and increased consumption of fresh fruit, vegetables, and low fat dairy, alcohol moderation, and smoking cessation.;Monitor prescription use compliance.   Expected Outcomes Short Term: Continued assessment and intervention until BP is < 140/4mm HG in hypertensive participants. < 130/63mm HG in hypertensive participants with diabetes, heart failure or chronic kidney disease.;Long Term: Maintenance of blood pressure at goal levels.   Lipids Yes   Intervention Provide education and support for participant on nutrition & aerobic/resistive exercise along with prescribed medications to achieve LDL 70mg , HDL >40mg .   Expected Outcomes Short Term: Participant states understanding of desired cholesterol values and is compliant with medications prescribed. Participant is following exercise prescription and nutrition guidelines.;Long Term: Cholesterol controlled with medications as prescribed, with individualized exercise RX and with personalized nutrition plan. Value goals: LDL < 70mg , HDL > 40 mg.      Core Components/Risk Factors/Patient Goals Review:     Core Components/Risk Factors/Patient Goals at Discharge (Final Review):    ITP Comments:     ITP Comments    Row Name 05/05/17 1334           ITP Comments Medical review completed.  Initial ITP created and ready for review and changes if indicates by [facility] Director.  Documentation of diagnosis can be found in [facility] 04/24/2017 admission          Comments: Initial ITP

## 2017-05-05 NOTE — Patient Instructions (Signed)
Patient Instructions  Patient Details  Name: Antonio Barnes MRN: [id number] Date of Birth: [birth year] Referring Provider:  Isaias Cowman, MD  Below are the personal goals you chose as well as exercise and nutrition goals. Our goal is to help you keep on track towards obtaining and maintaining your goals. We will be discussing your progress on these goals with you throughout the program.  Initial Exercise Prescription:     Initial Exercise Prescription - 05/05/17 1400      Date of Initial Exercise RX and Referring Provider   Date 05/05/17   Referring Provider Bartholome Bill MD     Treadmill   MPH 2.9   Grade 0.5   Minutes 15   METs 3.42     Recumbant Bike   Level 3   RPM 35   Watts 50   Minutes 15   METs 3.5     T5 Nustep   Level 3   SPM 80   Minutes 15   METs 3     Prescription Details   Frequency (times per week) 3   Duration Progress to 45 minutes of aerobic exercise without signs/symptoms of physical distress     Intensity   THRR 40-80% of Max Heartrate 93-132   Ratings of Perceived Exertion 11-13   Perceived Dyspnea 0-4     Progression   Progression Continue to progress workloads to maintain intensity without signs/symptoms of physical distress.     Resistance Training   Training Prescription Yes   Weight 4 lbs   Reps 10-15      Exercise Goals: Frequency: Be able to perform aerobic exercise three times per week working toward 3-5 days per week.  Intensity: Work with a perceived exertion of 11 (fairly light) - 15 (hard) as tolerated. Follow your new exercise prescription and watch for changes in prescription as you progress with the program. Changes will be reviewed with you when they are made.  Duration: You should be able to do 30 minutes of continuous aerobic exercise in addition to a 5 minute warm-up and a 5 minute cool-down routine.  Nutrition Goals: Your personal nutrition goals will be established when you do your nutrition analysis with  the dietician.  The following are nutrition guidelines to follow: Cholesterol < 200mg /day Sodium < 1500mg /day Fiber: Men over 50 yrs - 30 grams per day  Personal Goals:     Personal Goals and Risk Factors at Admission - 05/05/17 1410      Core Components/Risk Factors/Patient Goals on Admission    Weight Management Yes;Obesity   Intervention Weight Management: Develop a combined nutrition and exercise program designed to reach desired caloric intake, while maintaining appropriate intake of nutrient and fiber, sodium and fats, and appropriate energy expenditure required for the weight goal.;Weight Management: Provide education and appropriate resources to help participant work on and attain dietary goals.;Weight Management/Obesity: Establish reasonable short term and long term weight goals.;Obesity: Provide education and appropriate resources to help participant work on and attain dietary goals.   Admit Weight 222 lb 12.8 oz (101.1 kg)   Goal Weight: Short Term 220 lb (99.8 kg)   Goal Weight: Long Term 200 lb (90.7 kg)   Expected Outcomes Short Term: Continue to assess and modify interventions until short term weight is achieved;Long Term: Adherence to nutrition and physical activity/exercise program aimed toward attainment of established weight goal;Weight Loss: Understanding of general recommendations for a balanced deficit meal plan, which promotes 1-2 lb weight loss per week and includes a  negative energy balance of [phone] kcal/d   Tobacco Cessation Yes  Quit 4 months ago   Number of packs per day 1   Intervention Assist the participant in steps to quit. Provide individualized education and counseling about committing to Tobacco Cessation, relapse prevention, and pharmacological support that can be provided by physician.;Advice worker, assist with locating and accessing local/national Quit Smoking programs, and support quit date choice.   Expected Outcomes Short Term: Will  demonstrate readiness to quit, by selecting a quit date.;Long Term: Complete abstinence from all tobacco products for at least 12 months from quit date.;Short Term: Will quit all tobacco product use, adhering to prevention of relapse plan.   Hypertension Yes   Intervention Provide education on lifestyle modifcations including regular physical activity/exercise, weight management, moderate sodium restriction and increased consumption of fresh fruit, vegetables, and low fat dairy, alcohol moderation, and smoking cessation.;Monitor prescription use compliance.   Expected Outcomes Short Term: Continued assessment and intervention until BP is < 140/16mm HG in hypertensive participants. < 130/63mm HG in hypertensive participants with diabetes, heart failure or chronic kidney disease.;Long Term: Maintenance of blood pressure at goal levels.   Lipids Yes   Intervention Provide education and support for participant on nutrition & aerobic/resistive exercise along with prescribed medications to achieve LDL 70mg , HDL >40mg .   Expected Outcomes Short Term: Participant states understanding of desired cholesterol values and is compliant with medications prescribed. Participant is following exercise prescription and nutrition guidelines.;Long Term: Cholesterol controlled with medications as prescribed, with individualized exercise RX and with personalized nutrition plan. Value goals: LDL < 70mg , HDL > 40 mg.      Tobacco Use Initial Evaluation: History  Smoking Status  . Former Smoker  Smokeless Tobacco  . Never Used    Copy of goals given to participant.

## 2017-05-09 ENCOUNTER — Encounter: Payer: Medicare HMO | Admitting: *Deleted

## 2017-05-09 DIAGNOSIS — Z48812 Encounter for surgical aftercare following surgery on the circulatory system: Secondary | ICD-10-CM | POA: Diagnosis not present

## 2017-05-09 DIAGNOSIS — Z955 Presence of coronary angioplasty implant and graft: Secondary | ICD-10-CM | POA: Diagnosis not present

## 2017-05-09 NOTE — Progress Notes (Signed)
Daily Session Note  Patient Details  Name: Antonio Barnes MRN: [id number] Date of Birth: 1946/01/27 Referring Provider:     Cardiac Rehab from 05/05/2017 in [facility] Cardiac and Pulmonary Rehab  Referring Provider  Bartholome Bill MD      Encounter Date: 05/09/2017  Check In:     Session Check In - 05/09/17 0853      Check-In   Location [facility]-Cardiac & Pulmonary Rehab   Staff Present Alberteen Sam, MA, ACSM RCEP, Exercise Physiologist;Amanda Oletta Darter, BA, ACSM CEP, Exercise Physiologist;Carroll Enterkin, RN, BSN   Supervising physician immediately available to respond to emergencies See telemetry face sheet for immediately available ER MD   Medication changes reported     No   Fall or balance concerns reported    No   Warm-up and Cool-down Performed on first and last piece of equipment   Resistance Training Performed Yes   VAD Patient? No     Pain Assessment   Currently in Pain? No/denies   Multiple Pain Sites No         History  Smoking Status  . Former Smoker  . Packs/day: 2.00  . Years: 30.00  Smokeless Tobacco  . Never Used    Comment: Quit 4 months ago  after restarting 10 years ago 1 pack a day    Goals Met:  Exercise tolerated well Personal goals reviewed No report of cardiac concerns or symptoms Strength training completed today  Goals Unmet:  Not Applicable  Comments: First full day of exercise!  Patient was oriented to gym and equipment including functions, settings, policies, and procedures.  Patient's individual exercise prescription and treatment plan were reviewed.  All starting workloads were established based on the results of the 6 minute walk test done at initial orientation visit.  The plan for exercise progression was also introduced and progression will be customized based on patient's performance and goals.    Dr. Emily Filbert is Medical Director for [place] and [facility].

## 2017-05-12 ENCOUNTER — Encounter: Payer: Medicare HMO | Admitting: *Deleted

## 2017-05-12 DIAGNOSIS — Z955 Presence of coronary angioplasty implant and graft: Secondary | ICD-10-CM | POA: Diagnosis not present

## 2017-05-12 DIAGNOSIS — Z48812 Encounter for surgical aftercare following surgery on the circulatory system: Secondary | ICD-10-CM | POA: Diagnosis not present

## 2017-05-12 NOTE — Progress Notes (Signed)
Daily Session Note  Patient Details  Name: MOSTYN VARNELL MRN: [id number] Date of Birth: 1946-09-09 Referring Provider:     Cardiac Rehab from 05/05/2017 in [facility] Cardiac and Pulmonary Rehab  Referring Provider  Bartholome Bill MD      Encounter Date: 05/12/2017  Check In:     Session Check In - 05/12/17 0857      Check-In   Location [facility]-Cardiac & Pulmonary Rehab   Staff Present Earlean Shawl, BS, ACSM CEP, Exercise Physiologist;Carroll Enterkin, RN, Levie Heritage, MA, ACSM RCEP, Exercise Physiologist   Supervising physician immediately available to respond to emergencies See telemetry face sheet for immediately available ER MD   Medication changes reported     No   Fall or balance concerns reported    No   Tobacco Cessation No Change   Warm-up and Cool-down Performed on first and last piece of equipment   Resistance Training Performed Yes   VAD Patient? No     Pain Assessment   Currently in Pain? No/denies   Multiple Pain Sites No         History  Smoking Status  . Former Smoker  . Packs/day: 2.00  . Years: 30.00  Smokeless Tobacco  . Never Used    Comment: Quit 4 months ago  after restarting 10 years ago 1 pack a day    Goals Met:  Independence with exercise equipment Exercise tolerated well No report of cardiac concerns or symptoms Strength training completed today  Goals Unmet:  Not Applicable  Comments: Pt able to follow exercise prescription today without complaint.  Will continue to monitor for progression.    Dr. Emily Filbert is Medical Director for [place] and [facility].

## 2017-05-13 DIAGNOSIS — M5430 Sciatica, unspecified side: Secondary | ICD-10-CM | POA: Diagnosis not present

## 2017-05-13 DIAGNOSIS — I2510 Atherosclerotic heart disease of native coronary artery without angina pectoris: Secondary | ICD-10-CM | POA: Diagnosis not present

## 2017-05-14 DIAGNOSIS — Z48812 Encounter for surgical aftercare following surgery on the circulatory system: Secondary | ICD-10-CM | POA: Diagnosis not present

## 2017-05-14 DIAGNOSIS — Z955 Presence of coronary angioplasty implant and graft: Secondary | ICD-10-CM | POA: Diagnosis not present

## 2017-05-14 NOTE — Progress Notes (Signed)
Daily Session Note  Patient Details  Name: Antonio Barnes MRN: [id number] Date of Birth: 01/01/1946 Referring Provider:     Cardiac Rehab from 05/05/2017 in [facility] Cardiac and Pulmonary Rehab  Referring Provider  Fath, Kenneth MD      Encounter Date: 05/14/2017  Check In:     Session Check In - 05/14/17 0759      Check-In   Location [facility]-Cardiac & Pulmonary Rehab   Staff Present Jessica Hawkins, MA, ACSM RCEP, Exercise Physiologist;Susanne Bice, RN, BSN, CCRP;  RCP,RRT,BSRT   Supervising physician immediately available to respond to emergencies See telemetry face sheet for immediately available ER MD   Medication changes reported     No   Fall or balance concerns reported    No   Warm-up and Cool-down Performed on first and last piece of equipment   Resistance Training Performed Yes   VAD Patient? No     Pain Assessment   Currently in Pain? No/denies   Multiple Pain Sites No         History  Smoking Status  . Former Smoker  . Packs/day: 2.00  . Years: 30.00  Smokeless Tobacco  . Never Used    Comment: Quit 4 months ago  after restarting 10 years ago 1 pack a day    Goals Met:  Independence with exercise equipment Exercise tolerated well No report of cardiac concerns or symptoms Strength training completed today  Goals Unmet:  Not Applicable  Comments: Pt able to follow exercise prescription today without complaint.  Will continue to monitor for progression.   Dr. Mark Miller is Medical Director for [facility] and [facility]. 

## 2017-05-16 DIAGNOSIS — Z955 Presence of coronary angioplasty implant and graft: Secondary | ICD-10-CM | POA: Diagnosis not present

## 2017-05-16 DIAGNOSIS — Z48812 Encounter for surgical aftercare following surgery on the circulatory system: Secondary | ICD-10-CM | POA: Diagnosis not present

## 2017-05-16 NOTE — Progress Notes (Signed)
Daily Session Note  Patient Details  Name: Antonio Barnes MRN: [id number] Date of Birth: 06/09/1946 Referring Provider:     Cardiac Rehab from 05/05/2017 in [facility] Cardiac and Pulmonary Rehab  Referring Provider  Fath, Kenneth MD      Encounter Date: 05/16/2017  Check In:     Session Check In - 05/16/17 0819      Check-In   Location [facility]-Cardiac & Pulmonary Rehab   Staff Present Carroll Enterkin, RN, BSN;Jessica Hawkins, MA, ACSM RCEP, Exercise Physiologist;Amanda Sommer, BA, ACSM CEP, Exercise Physiologist   Supervising physician immediately available to respond to emergencies See telemetry face sheet for immediately available ER MD   Medication changes reported     No   Fall or balance concerns reported    No   Warm-up and Cool-down Performed on first and last piece of equipment   Resistance Training Performed Yes   VAD Patient? No     Pain Assessment   Currently in Pain? No/denies         History  Smoking Status  . Former Smoker  . Packs/day: 2.00  . Years: 30.00  Smokeless Tobacco  . Never Used    Comment: Quit 4 months ago  after restarting 10 years ago 1 pack a day    Goals Met:  Independence with exercise equipment Exercise tolerated well No report of cardiac concerns or symptoms Strength training completed today  Goals Unmet:  Not Applicable  Comments: Reviewed home exercise with pt today.  Pt plans to walk, bowflex and handweights for exercise.  Reviewed THR, pulse, RPE, sign and symptoms, NTG use, and when to call 911 or MD.  Also discussed weather considerations and indoor options.  Pt voiced understanding.   Dr. Mark Miller is Medical Director for [facility] and [facility]. 

## 2017-05-21 ENCOUNTER — Encounter: Payer: Medicare HMO | Attending: Cardiology

## 2017-05-21 DIAGNOSIS — Z48812 Encounter for surgical aftercare following surgery on the circulatory system: Secondary | ICD-10-CM | POA: Diagnosis not present

## 2017-05-21 DIAGNOSIS — Z955 Presence of coronary angioplasty implant and graft: Secondary | ICD-10-CM | POA: Diagnosis not present

## 2017-05-21 NOTE — Progress Notes (Signed)
Daily Session Note  Patient Details  Name: FALLON HOWERTER MRN: [id number] Date of Birth: 02/17/1946 Referring Provider:     Cardiac Rehab from 05/05/2017 in [facility] Cardiac and Pulmonary Rehab  Referring Provider  Bartholome Bill MD      Encounter Date: 05/21/2017  Check In:     Session Check In - 05/21/17 0745      Check-In   Location [facility]-Cardiac & Pulmonary Rehab   Staff Present Alberteen Sam, MA, ACSM RCEP, Exercise Physiologist;Susanne Bice, RN, BSN, Florestine Avers, RN BSN;Knolan Simien [employer] physician immediately available to respond to emergencies See telemetry face sheet for immediately available ER MD   Medication changes reported     No   Fall or balance concerns reported    No   Warm-up and Cool-down Performed on first and last piece of equipment   Resistance Training Performed Yes   VAD Patient? No     Pain Assessment   Currently in Pain? No/denies   Multiple Pain Sites No         History  Smoking Status  . Former Smoker  . Packs/day: 2.00  . Years: 30.00  Smokeless Tobacco  . Never Used    Comment: Quit 4 months ago  after restarting 10 years ago 1 pack a day    Goals Met:  Independence with exercise equipment Exercise tolerated well No report of cardiac concerns or symptoms Strength training completed today  Goals Unmet:  Not Applicable  Comments: Pt able to follow exercise prescription today without complaint.  Will continue to monitor for progression.   Dr. Emily Filbert is Medical Director for [place] and [facility].

## 2017-05-23 ENCOUNTER — Encounter: Payer: Medicare HMO | Admitting: *Deleted

## 2017-05-23 DIAGNOSIS — Z48812 Encounter for surgical aftercare following surgery on the circulatory system: Secondary | ICD-10-CM | POA: Diagnosis not present

## 2017-05-23 DIAGNOSIS — Z955 Presence of coronary angioplasty implant and graft: Secondary | ICD-10-CM | POA: Diagnosis not present

## 2017-05-23 NOTE — Progress Notes (Signed)
Daily Session Note  Patient Details  Name: Antonio Barnes MRN: [id number] Date of Birth: 09-23-1945 Referring Provider:     Cardiac Rehab from 05/05/2017 in [facility] Cardiac and Pulmonary Rehab  Referring Provider  Bartholome Bill MD      Encounter Date: 05/23/2017  Check In:     Session Check In - 05/23/17 7972      Check-In   Location [facility]-Cardiac & Pulmonary Rehab   Staff Present Alberteen Sam, MA, ACSM RCEP, Exercise Physiologist;Amanda Oletta Darter, BA, ACSM CEP, Exercise Physiologist;Carroll Enterkin, RN, BSN   Supervising physician immediately available to respond to emergencies See telemetry face sheet for immediately available ER MD   Medication changes reported     No   Fall or balance concerns reported    No   Warm-up and Cool-down Performed on first and last piece of equipment   Resistance Training Performed Yes   VAD Patient? No     Pain Assessment   Currently in Pain? No/denies   Multiple Pain Sites No         History  Smoking Status  . Former Smoker  . Packs/day: 2.00  . Years: 30.00  Smokeless Tobacco  . Never Used    Comment: Quit 4 months ago  after restarting 10 years ago 1 pack a day    Goals Met:  Independence with exercise equipment Exercise tolerated well No report of cardiac concerns or symptoms Strength training completed today  Goals Unmet:  Not Applicable  Comments: Pt able to follow exercise prescription today without complaint.  Will continue to monitor for progression. Started interval training today   Dr. Emily Filbert is Medical Director for [place] and [facility].

## 2017-05-26 ENCOUNTER — Encounter: Payer: Medicare HMO | Admitting: *Deleted

## 2017-05-26 DIAGNOSIS — Z955 Presence of coronary angioplasty implant and graft: Secondary | ICD-10-CM

## 2017-05-26 DIAGNOSIS — Z48812 Encounter for surgical aftercare following surgery on the circulatory system: Secondary | ICD-10-CM | POA: Diagnosis not present

## 2017-05-26 NOTE — Progress Notes (Signed)
Daily Session Note  Patient Details  Name: Antonio Barnes MRN: [id number] Date of Birth: January 14, 1946 Referring Provider:     Cardiac Rehab from 05/05/2017 in [facility] Cardiac and Pulmonary Rehab  Referring Provider  Bartholome Bill MD      Encounter Date: 05/26/2017  Check In:     Session Check In - 05/26/17 0749      Check-In   Location [facility]-Cardiac & Pulmonary Rehab   Staff Present Alberteen Sam, MA, ACSM RCEP, Exercise Physiologist;Carroll Enterkin, RN, Moises Blood, BS, ACSM CEP, Exercise Physiologist   Supervising physician immediately available to respond to emergencies See telemetry face sheet for immediately available ER MD   Medication changes reported     No   Fall or balance concerns reported    No   Tobacco Cessation No Change   Warm-up and Cool-down Performed on first and last piece of equipment   Resistance Training Performed Yes   VAD Patient? No     Pain Assessment   Currently in Pain? No/denies   Multiple Pain Sites No         History  Smoking Status  . Former Smoker  . Packs/day: 2.00  . Years: 30.00  Smokeless Tobacco  . Never Used    Comment: Quit 4 months ago  after restarting 10 years ago 1 pack a day    Goals Met:  Independence with exercise equipment Exercise tolerated well No report of cardiac concerns or symptoms Strength training completed today  Goals Unmet:  Not Applicable  Comments: Pt able to follow exercise prescription today without complaint.  Will continue to monitor for progression.    Dr. Emily Filbert is Medical Director for [place] and [facility].

## 2017-05-28 ENCOUNTER — Encounter: Payer: Self-pay | Admitting: *Deleted

## 2017-05-28 DIAGNOSIS — Z48812 Encounter for surgical aftercare following surgery on the circulatory system: Secondary | ICD-10-CM | POA: Diagnosis not present

## 2017-05-28 DIAGNOSIS — Z955 Presence of coronary angioplasty implant and graft: Secondary | ICD-10-CM

## 2017-05-28 NOTE — Progress Notes (Signed)
Cardiac Individual Treatment Plan  Patient Details  Name: Antonio Barnes MRN: [id number] Date of Birth: 11/08/1945 Referring Provider:     Cardiac Rehab from 05/05/2017 in [facility]  Referring Provider  Bartholome Bill MD      Initial Encounter Date:    Cardiac Rehab from 05/05/2017 in [facility] Cardiac and Pulmonary Rehab  Date  05/05/17  Referring Provider  Bartholome Bill MD      Visit Diagnosis: Status post coronary artery stent placement  Patient's Home Medications on Admission:  Current Outpatient Prescriptions:  .  Adalimumab (HUMIRA) 40 MG/0.8ML PSKT, Inject 40 mg into the skin. Every 2 weeks as needed for psoriasis, Disp: , Rfl:  .  albuterol (PROVENTIL HFA;VENTOLIN HFA) 108 (90 Base) MCG/ACT inhaler, Inhale 2 puffs into the lungs every 6 (six) hours as needed for wheezing or shortness of breath., Disp: , Rfl:  .  amLODipine-benazepril (LOTREL) 5-20 MG per capsule, Take 1 capsule by mouth daily., Disp: , Rfl:  .  aspirin EC 81 MG tablet, Take 81 mg by mouth daily., Disp: , Rfl:  .  betamethasone dipropionate (DIPROLENE) 0.05 % cream, Apply 1 application topically daily as needed (psoriasis). , Disp: , Rfl:  .  carvedilol (COREG) 25 MG tablet, Take 12.5 mg by mouth 2 (two) times daily with a meal. , Disp: , Rfl:  .  clopidogrel (PLAVIX) 75 MG tablet, Take 1 tablet (75 mg total) by mouth daily with breakfast., Disp: 30 tablet, Rfl: 11 .  ezetimibe (ZETIA) 10 MG tablet, Take 10 mg by mouth daily., Disp: , Rfl:  .  fexofenadine (WAL-FEX) 180 MG tablet, Take 180 mg by mouth daily as needed for allergies or rhinitis., Disp: , Rfl:  .  hydrochlorothiazide (HYDRODIURIL) 25 MG tablet, Take 25 mg by mouth daily., Disp: , Rfl:  .  levothyroxine (SYNTHROID, LEVOTHROID) 75 MCG tablet, Take 75 mcg by mouth daily before breakfast., Disp: , Rfl:  .  naproxen sodium (ANAPROX) 220 MG tablet, Take 440 mg by mouth daily as needed (pain). , Disp: , Rfl:  .  sildenafil (REVATIO) 20 MG  tablet, Take 40-100 mg by mouth daily as needed (erectile dysfunction). , Disp: , Rfl:   Past Medical History: Past Medical History:  Diagnosis Date  . Asthma   . Diabetes mellitus without complication ([place])   . Hypertension   . Myocardial infarct ([place])   . Prostate cancer ([place])     Tobacco Use: History  Smoking Status  . Former Smoker  . Packs/day: 2.00  . Years: 30.00  Smokeless Tobacco  . Never Used    Comment: Quit 4 months ago  after restarting 10 years ago 1 pack a day    Labs: Recent Review Flowsheet Data    There is no flowsheet data to display.       Exercise Target Goals:    Exercise Program Goal: Individual exercise prescription set with THRR, safety & activity barriers. Participant demonstrates ability to understand and report RPE using BORG scale, to self-measure pulse accurately, and to acknowledge the importance of the exercise prescription.  Exercise Prescription Goal: Starting with aerobic activity 30 plus minutes a day, 3 days per week for initial exercise prescription. Provide home exercise prescription and guidelines that participant acknowledges understanding prior to discharge.  Activity Barriers & Risk Stratification:     Activity Barriers & Cardiac Risk Stratification - 05/05/17 1343      Activity Barriers & Cardiac Risk Stratification   Activity Barriers Joint Problems;Deconditioning;Muscular  Weakness;Shortness of Breath  Right hip  - standing or moving can cause pain in hip and down his leg. Has had cortisone shots that help alleviate the pain, possible  [employer]ica   Cardiac Risk Stratification High      6 Minute Walk:     6 Minute Walk    Row Name 05/05/17 1411         6 Minute Walk   Phase Initial     Distance 1560 feet     Walk Time 6 minutes     # of Rest Breaks 0     MPH 2.95     METS 3.57     RPE 12     Perceived Dyspnea  2     VO2 Peak 12.5     Symptoms Yes (comment)     Comments SOB     Resting HR 53 bpm      Resting BP 103/74     Max Ex. HR 108 bpm     Max Ex. BP 132/64     2 Minute Post BP 122/60        Oxygen Initial Assessment:   Oxygen Re-Evaluation:   Oxygen Discharge (Final Oxygen Re-Evaluation):   Initial Exercise Prescription:     Initial Exercise Prescription - 05/05/17 1400      Date of Initial Exercise RX and Referring Provider   Date 05/05/17   Referring Provider Bartholome Bill MD     Treadmill   MPH 2.9   Grade 0.5   Minutes 15   METs 3.42     Recumbant Bike   Level 3   RPM 35   Watts 50   Minutes 15   METs 3.5     T5 Nustep   Level 3   SPM 80   Minutes 15   METs 3     Prescription Details   Frequency (times per week) 3   Duration Progress to 45 minutes of aerobic exercise without signs/symptoms of physical distress     Intensity   THRR 40-80% of Max Heartrate 93-132   Ratings of Perceived Exertion 11-13   Perceived Dyspnea 0-4     Progression   Progression Continue to progress workloads to maintain intensity without signs/symptoms of physical distress.     Resistance Training   Training Prescription Yes   Weight 4 lbs   Reps 10-15      Perform Capillary Blood Glucose checks as needed.  Exercise Prescription Changes:     Exercise Prescription Changes    Row Name 05/05/17 1300 05/16/17 0800 05/21/17 1500         Response to Exercise   Blood Pressure (Admit) 130/74  - 118/74     Blood Pressure (Exercise) 132/64  - 130/72     Blood Pressure (Exit) 122/60  - 124/76     Heart Rate (Admit) 53 bpm  - 64 bpm     Heart Rate (Exercise) 108 bpm  - 95 bpm     Heart Rate (Exit) 68 bpm  - 62 bpm     Oxygen Saturation (Admit) 99 %  -  -     Oxygen Saturation (Exercise) 99 %  -  -     Rating of Perceived Exertion (Exercise) 12  - 12     Perceived Dyspnea (Exercise) 2  -  -     Symptoms SOB  - none     Comments walk test results  -  -  Duration  -  - Continue with 45 min of aerobic exercise without signs/symptoms of physical distress.      Intensity  -  - THRR unchanged       Progression   Progression  -  - Continue to progress workloads to maintain intensity without signs/symptoms of physical distress.     Average METs  -  - 3.46       Resistance Training   Training Prescription  -  - Yes     Weight  -  - 4 lbs     Reps  -  - 10-15       Interval Training   Interval Training  -  - No       Treadmill   MPH  -  - 3     Grade  -  - 2     Minutes  -  - 15     METs  -  - 4.12       Recumbant Bike   Level  -  - 8     Watts  -  - 64     Minutes  -  - 15     METs  -  - 4.05       T5 Nustep   Level  -  - 5     Minutes  -  - 15     METs  -  - 2.2       Home Exercise Plan   Plans to continue exercise at  - Home (comment)  walking, Bowflex, and handweights Home (comment)  walking, Bowflex, and handweights     Frequency  - Add 1 additional day to program exercise sessions. Add 1 additional day to program exercise sessions.     Initial Home Exercises Provided  - 05/16/17 05/16/17        Exercise Comments:     Exercise Comments    Row Name 05/09/17 0854 05/23/17 0813         Exercise Comments  First full day of exercise!  Patient was oriented to gym and equipment including functions, settings, policies, and procedures.  Patient's individual exercise prescription and treatment plan were reviewed.  All starting workloads were established based on the results of the 6 minute walk test done at initial orientation visit.  The plan for exercise progression was also introduced and progression will be customized based on patient's performance and goals. Started interval training today!         Exercise Goals and Review:     Exercise Goals    Row Name 05/05/17 1415             Exercise Goals   Increase Physical Activity Yes       Intervention Provide advice, education, support and counseling about physical activity/exercise needs.;Develop an individualized exercise prescription for aerobic and resistive  training based on initial evaluation findings, risk stratification, comorbidities and participant's personal goals.       Expected Outcomes Achievement of increased cardiorespiratory fitness and enhanced flexibility, muscular endurance and strength shown through measurements of functional capacity and personal statement of participant.       Increase Strength and Stamina Yes       Intervention Provide advice, education, support and counseling about physical activity/exercise needs.;Develop an individualized exercise prescription for aerobic and resistive training based on initial evaluation findings, risk stratification, comorbidities and participant's personal goals.       Expected Outcomes Achievement of increased cardiorespiratory  fitness and enhanced flexibility, muscular endurance and strength shown through measurements of functional capacity and personal statement of participant.          Exercise Goals Re-Evaluation :     Exercise Goals Re-Evaluation    Row Name 05/16/17 0829 05/21/17 1525           Exercise Goal Re-Evaluation   Exercise Goals Review Increase Physical Activity;Increase Strength and Stamina;Able to understand and use rate of perceived exertion (RPE) scale;Knowledge and understanding of Target Heart Rate Range (THRR);Able to check pulse independently;Understanding of Exercise Prescription Increase Strength and Stamina;Increase Physical Activity      Comments Reviewed home exercise with pt today.  Pt plans to walk, bowflex and handweights for exercise.  Reviewed THR, pulse, RPE, sign and symptoms, NTG use, and when to call 911 or MD.  Also discussed weather considerations and indoor options.  Pt voiced understanding. Ed is off to a good start in rehab.   He is already up to 64 watts on the recumbent bike.  We will talk about possibly adding in intervals to prescription.  We will continue to monitor his progression.      Expected Outcomes Short - Add one day of exercise  outside class times Long - maintain independent exercise   Short: Add in intervals.  Long: Make exercise part of routine.          Discharge Exercise Prescription (Final Exercise Prescription Changes):     Exercise Prescription Changes - 05/21/17 1500      Response to Exercise   Blood Pressure (Admit) 118/74   Blood Pressure (Exercise) 130/72   Blood Pressure (Exit) 124/76   Heart Rate (Admit) 64 bpm   Heart Rate (Exercise) 95 bpm   Heart Rate (Exit) 62 bpm   Rating of Perceived Exertion (Exercise) 12   Symptoms none   Duration Continue with 45 min of aerobic exercise without signs/symptoms of physical distress.   Intensity THRR unchanged     Progression   Progression Continue to progress workloads to maintain intensity without signs/symptoms of physical distress.   Average METs 3.46     Resistance Training   Training Prescription Yes   Weight 4 lbs   Reps 10-15     Interval Training   Interval Training No     Treadmill   MPH 3   Grade 2   Minutes 15   METs 4.12     Recumbant Bike   Level 8   Watts 64   Minutes 15   METs 4.05     T5 Nustep   Level 5   Minutes 15   METs 2.2     Home Exercise Plan   Plans to continue exercise at Home (comment)  walking, Bowflex, and handweights   Frequency Add 1 additional day to program exercise sessions.   Initial Home Exercises Provided 05/16/17      Nutrition:  Target Goals: Understanding of nutrition guidelines, daily intake of sodium <1563m, cholesterol <2070m calories 30% from fat and 7% or less from saturated fats, daily to have 5 or more servings of fruits and vegetables.  Biometrics:     Pre Biometrics - 05/05/17 1416      Pre Biometrics   Height '6\' 2"'  (1.88 m)   Weight 222 lb 12.8 oz (101.1 kg)   Waist Circumference 44.5 inches   Hip Circumference 39.5 inches   Waist to Hip Ratio 1.13 %   BMI (Calculated) 28.7   Single Leg Stand 20.07 seconds  Nutrition Therapy Plan and Nutrition Goals:      Nutrition Therapy & Goals - 05/26/17 1428      Nutrition Therapy   Diet Instructed on a meal plan based on heart healthy dietary guidelines and including diabetes dietary guidelines.   Protein (specify units) 8   Fiber 30 grams   Whole Grain Foods 3 servings   Saturated Fats 14 max. grams   Fruits and Vegetables 5 servings/day   Sodium 2000 grams  1500 mg-ideal     Personal Nutrition Goals   Nutrition Goal Pour water off canned vegetables and replace with fresh water to reduce sodium.   Personal Goal #2 Decrease added salt. Try Morton lite salt and limit its use.   Personal Goal #3 If choose one meal that is high in fat, try to make lower fat choices at other meals that day.   Personal Goal #4 Switch to unsweetened tea added with Splenda or Sweet 'n low.     Intervention Plan   Intervention Prescribe, educate and counsel regarding individualized specific dietary modifications aiming towards targeted core components such as weight, hypertension, lipid management, diabetes, heart failure and other comorbidities.;Nutrition handout(s) given to patient.   Expected Outcomes Short Term Goal: Understand basic principles of dietary content, such as calories, fat, sodium, cholesterol and nutrients.;Short Term Goal: A plan has been developed with personal nutrition goals set during dietitian appointment.;Long Term Goal: Adherence to prescribed nutrition plan.      Nutrition Discharge: Rate Your Plate Scores:     Nutrition Assessments - 05/05/17 1346      MEDFICTS Scores   Pre Score 140      Nutrition Goals Re-Evaluation:   Nutrition Goals Discharge (Final Nutrition Goals Re-Evaluation):   Psychosocial: Target Goals: Acknowledge presence or absence of significant depression and/or stress, maximize coping skills, provide positive support system. Participant is able to verbalize types and ability to use techniques and skills needed for reducing stress and depression.   Initial  Review & Psychosocial Screening:     Initial Psych Review & Screening - 05/05/17 1413      Initial Review   Current issues with None Identified     Family Dynamics   Good Support System? Yes  children        Barriers   Psychosocial barriers to participate in program There are no identifiable barriers or psychosocial needs.;The patient should benefit from training in stress management and relaxation.     Screening Interventions   Interventions Encouraged to exercise;Provide feedback about the scores to participant;To provide support and resources with identified psychosocial needs      Quality of Life Scores:      Quality of Life - 05/05/17 1415      Quality of Life Scores   Health/Function Pre 23.23 %   Socioeconomic Pre 26.79 %   Psych/Spiritual Pre 23.64 %   Family Pre 26.9 %   GLOBAL Pre 24.59 %      PHQ-9: Recent Review Flowsheet Data    Depression screen [facility] 2/9 05/05/2017   Decreased Interest 1   Down, Depressed, Hopeless 0   PHQ - 2 Score 1   Altered sleeping 1   Tired, decreased energy 1   Change in appetite 2   Feeling bad or failure about yourself  0   Trouble concentrating 0   Moving slowly or fidgety/restless 0   Suicidal thoughts 0   PHQ-9 Score 5   Difficult doing work/chores Not difficult at all  Interpretation of Total Score  Total Score Depression Severity:  1-4 = Minimal depression, 5-9 = Mild depression, 10-14 = Moderate depression, 15-19 = Moderately severe depression, 20-27 = Severe depression   Psychosocial Evaluation and Intervention:     Psychosocial Evaluation - 05/21/17 0928      Psychosocial Evaluation & Interventions   Interventions Encouraged to exercise with the program and follow exercise prescription   Comments Counselor met with Mr. Murley (Ed) today for initial psychosocial evaluation.  He is a 71 year old who had a stent inserted several weeks ago and was encouraged to come to this program by his Dr.  He has a strong  support system with a son and daughter who live close by.  Ed has minimal health issues and reports sleeping well and eating "too good."  He denies a history of depression or anxiety or any current symptoms and has minimal stress in his life at this time.  He has goals to lose weight and get in the habit of exercising more consistently.  Counselor discussed some options for Ed to consider once he completes this program to continue this positive habit.  Staff will follow with Ed throughout the course of this program.    Expected Outcomes Ed will benefit from consistent exercise to achieve his stated goals.  The educational and psychoeducational components of this program will be helpful in understanding and coping with his illness and improving his eating habits.  Ed will see the dietician to address his weight loss goals.     Continue Psychosocial Services  Follow up required by staff      Psychosocial Re-Evaluation:   Psychosocial Discharge (Final Psychosocial Re-Evaluation):   Vocational Rehabilitation: Provide vocational rehab assistance to qualifying candidates.   Vocational Rehab Evaluation & Intervention:     Vocational Rehab - 05/05/17 1420      Initial Vocational Rehab Evaluation & Intervention   Assessment shows need for Vocational Rehabilitation No      Education: Education Goals: Education classes will be provided on a variety of topics geared toward better understanding of heart health and risk factor modification. Participant will state understanding/return demonstration of topics presented as noted by education test scores.  Learning Barriers/Preferences:     Learning Barriers/Preferences - 05/05/17 1417      Learning Barriers/Preferences   Learning Barriers Hearing  Does not have hearing aids   Learning Preferences None      Education Topics: General Nutrition Guidelines/Fats and Fiber: -Group instruction provided by verbal, written material, models and  posters to present the general guidelines for heart healthy nutrition. Gives an explanation and review of dietary fats and fiber.   Controlling Sodium/Reading Food Labels: -Group verbal and written material supporting the discussion of sodium use in heart healthy nutrition. Review and explanation with models, verbal and written materials for utilization of the food label.   Cardiac Rehab from 05/28/2017 in [facility] Cardiac and Pulmonary Rehab  Date  05/12/17  Educator  CR  Instruction Review Code  1- Verbalizes Understanding      Exercise Physiology & Risk Factors: - Group verbal and written instruction with models to review the exercise physiology of the cardiovascular system and associated critical values. Details cardiovascular disease risk factors and the goals associated with each risk factor.   Cardiac Rehab from 05/28/2017 in [facility] Cardiac and Pulmonary Rehab  Date  05/21/17  Educator  [facility]  Instruction Review Code  1- Verbalizes Understanding      Aerobic Exercise & Resistance  Training: - Gives group verbal and written discussion on the health impact of inactivity. On the components of aerobic and resistive training programs and the benefits of this training and how to safely progress through these programs.   Cardiac Rehab from 05/28/2017 in [facility] Cardiac and Pulmonary Rehab  Date  05/26/17  Educator  [facility]  Instruction Review Code  1- Verbalizes Understanding      Flexibility, Balance, General Exercise Guidelines: - Provides group verbal and written instruction on the benefits of flexibility and balance training programs. Provides general exercise guidelines with specific guidelines to those with heart or lung disease. Demonstration and skill practice provided.   Cardiac Rehab from 05/28/2017 in [facility] Cardiac and Pulmonary Rehab  Date  05/28/17  Educator  [facility]  Instruction Review Code  1- Verbalizes Understanding      Stress Management: - Provides group verbal and written instruction  about the health risks of elevated stress, cause of high stress, and healthy ways to reduce stress.   Depression: - Provides group verbal and written instruction on the correlation between heart/lung disease and depressed mood, treatment options, and the stigmas associated with seeking treatment.   Anatomy & Physiology of the Heart: - Group verbal and written instruction and models provide basic cardiac anatomy and physiology, with the coronary electrical and arterial systems. Review of: AMI, Angina, Valve disease, Heart Failure, Cardiac Arrhythmia, Pacemakers, and the ICD.   Cardiac Procedures: - Group verbal and written instruction to review commonly prescribed medications for heart disease. Reviews the medication, class of the drug, and side effects. Includes the steps to properly store meds and maintain the prescription regimen. (beta blockers and nitrates)   Cardiac Medications I: - Group verbal and written instruction to review commonly prescribed medications for heart disease. Reviews the medication, class of the drug, and side effects. Includes the steps to properly store meds and maintain the prescription regimen.   Cardiac Medications II: -Group verbal and written instruction to review commonly prescribed medications for heart disease. Reviews the medication, class of the drug, and side effects. (all other drug classes)    Go Sex-Intimacy & Heart Disease, Get SMART - Goal Setting: - Group verbal and written instruction through game format to discuss heart disease and the return to sexual intimacy. Provides group verbal and written material to discuss and apply goal setting through the application of the S.M.A.R.T. Method.   Other Matters of the Heart: - Provides group verbal, written materials and models to describe Heart Failure, Angina, Valve Disease, Peripheral Artery Disease, and Diabetes in the realm of heart disease. Includes description of the disease process and  treatment options available to the cardiac patient.   Exercise & Equipment Safety: - Individual verbal instruction and demonstration of equipment use and safety with use of the equipment.   Cardiac Rehab from 05/28/2017 in [facility] Cardiac and Pulmonary Rehab  Date  05/05/17  Educator  SB      Infection Prevention: - Provides verbal and written material to individual with discussion of infection control including proper hand washing and proper equipment cleaning during exercise session.   Cardiac Rehab from 05/28/2017 in [facility] Cardiac and Pulmonary Rehab  Date  05/05/17  Educator  SB      Falls Prevention: - Provides verbal and written material to individual with discussion of falls prevention and safety.   Cardiac Rehab from 05/28/2017 in [facility] Cardiac and Pulmonary Rehab  Date  05/05/17  Educator  SB  Instruction Review Code (retired)  2- meets  goals/outcomes      Diabetes: - Individual verbal and written instruction to review signs/symptoms of diabetes, desired ranges of glucose level fasting, after meals and with exercise. Acknowledge that pre and post exercise glucose checks will be done for 3 sessions at entry of program.   Other: -Provides group and verbal instruction on various topics (see comments)    Knowledge Questionnaire Score:     Knowledge Questionnaire Score - 05/05/17 1417      Knowledge Questionnaire Score   Pre Score 16/28  Reviewed corrct responses with Ed today.  He verbalized understanding of the correct responses      Core Components/Risk Factors/Patient Goals at Admission:     Personal Goals and Risk Factors at Admission - 05/05/17 1410      Core Components/Risk Factors/Patient Goals on Admission    Weight Management Yes;Obesity   Intervention Weight Management: Develop a combined nutrition and exercise program designed to reach desired caloric intake, while maintaining appropriate intake of nutrient and fiber, sodium and fats, and appropriate  energy expenditure required for the weight goal.;Weight Management: Provide education and appropriate resources to help participant work on and attain dietary goals.;Weight Management/Obesity: Establish reasonable short term and long term weight goals.;Obesity: Provide education and appropriate resources to help participant work on and attain dietary goals.   Admit Weight 222 lb 12.8 oz (101.1 kg)   Goal Weight: Short Term 220 lb (99.8 kg)   Goal Weight: Long Term 200 lb (90.7 kg)   Expected Outcomes Short Term: Continue to assess and modify interventions until short term weight is achieved;Long Term: Adherence to nutrition and physical activity/exercise program aimed toward attainment of established weight goal;Weight Loss: Understanding of general recommendations for a balanced deficit meal plan, which promotes 1-2 lb weight loss per week and includes a negative energy balance of [phone] kcal/d   Tobacco Cessation Yes  Quit 4 months ago   Number of packs per day 1   Intervention Assist the participant in steps to quit. Provide individualized education and counseling about committing to Tobacco Cessation, relapse prevention, and pharmacological support that can be provided by physician.;Advice worker, assist with locating and accessing local/national Quit Smoking programs, and support quit date choice.   Expected Outcomes Short Term: Will demonstrate readiness to quit, by selecting a quit date.;Long Term: Complete abstinence from all tobacco products for at least 12 months from quit date.;Short Term: Will quit all tobacco product use, adhering to prevention of relapse plan.   Hypertension Yes   Intervention Provide education on lifestyle modifcations including regular physical activity/exercise, weight management, moderate sodium restriction and increased consumption of fresh fruit, vegetables, and low fat dairy, alcohol moderation, and smoking cessation.;Monitor prescription use  compliance.   Expected Outcomes Short Term: Continued assessment and intervention until BP is < 140/63m HG in hypertensive participants. < 130/868mHG in hypertensive participants with diabetes, heart failure or chronic kidney disease.;Long Term: Maintenance of blood pressure at goal levels.   Lipids Yes   Intervention Provide education and support for participant on nutrition & aerobic/resistive exercise along with prescribed medications to achieve LDL <7023mHDL >78m73m Expected Outcomes Short Term: Participant states understanding of desired cholesterol values and is compliant with medications prescribed. Participant is following exercise prescription and nutrition guidelines.;Long Term: Cholesterol controlled with medications as prescribed, with individualized exercise RX and with personalized nutrition plan. Value goals: LDL < 70mg69mL > 40 mg.      Core Components/Risk Factors/Patient Goals Review:    Core  Components/Risk Factors/Patient Goals at Discharge (Final Review):    ITP Comments:     ITP Comments    Row Name 05/05/17 1334 05/28/17 1053         ITP Comments Medical review completed.  Initial ITP created and ready for review and changes if indicates by [facility] Director.  Documentation of diagnosis can be found in [facility] 04/24/2017 admission 30 day review. Continue with ITP unless directed changes per Medical Director review.           Comments:

## 2017-05-28 NOTE — Progress Notes (Signed)
Daily Session Note  Patient Details  Name: MAHAMED ZALEWSKI MRN: [id number] Date of Birth: 03-24-1946 Referring Provider:     Cardiac Rehab from 05/05/2017 in [facility] Cardiac and Pulmonary Rehab  Referring Provider  Bartholome Bill MD      Encounter Date: 05/28/2017  Check In:     Session Check In - 05/28/17 0757      Check-In   Location [facility]-Cardiac & Pulmonary Rehab   Staff Present Alberteen Sam, MA, ACSM RCEP, Exercise Physiologist;Krista Frederico Hamman, RN BSN;Preethi Scantlebury Flavia Shipper   Supervising physician immediately available to respond to emergencies See telemetry face sheet for immediately available ER MD   Medication changes reported     No   Fall or balance concerns reported    No   Warm-up and Cool-down Performed on first and last piece of equipment   Resistance Training Performed Yes   VAD Patient? No     Pain Assessment   Currently in Pain? No/denies   Multiple Pain Sites No         History  Smoking Status  . Former Smoker  . Packs/day: 2.00  . Years: 30.00  Smokeless Tobacco  . Never Used    Comment: Quit 4 months ago  after restarting 10 years ago 1 pack a day    Goals Met:  Independence with exercise equipment Exercise tolerated well No report of cardiac concerns or symptoms Strength training completed today  Goals Unmet:  Not Applicable  Comments: Pt able to follow exercise prescription today without complaint.  Will continue to monitor for progression.   Dr. Emily Filbert is Medical Director for [place] and [facility].

## 2017-06-09 ENCOUNTER — Encounter: Payer: Medicare HMO | Admitting: *Deleted

## 2017-06-09 DIAGNOSIS — Z955 Presence of coronary angioplasty implant and graft: Secondary | ICD-10-CM

## 2017-06-09 DIAGNOSIS — Z48812 Encounter for surgical aftercare following surgery on the circulatory system: Secondary | ICD-10-CM | POA: Diagnosis not present

## 2017-06-09 NOTE — Progress Notes (Signed)
Daily Session Note  Patient Details  Name: Antonio Barnes MRN: [id number] Date of Birth: 1946/05/18 Referring Provider:     Cardiac Rehab from 05/05/2017 in [facility] Cardiac and Pulmonary Rehab  Referring Provider  Bartholome Bill MD      Encounter Date: 06/09/2017  Check In:     Session Check In - 06/09/17 0810      Check-In   Location [facility]-Cardiac & Pulmonary Rehab   Staff Present Gerlene Burdock, RN, Levie Heritage, MA, ACSM RCEP, Exercise Physiologist;Kelly Amedeo Plenty, BS, ACSM CEP, Exercise Physiologist   Supervising physician immediately available to respond to emergencies See telemetry face sheet for immediately available ER MD   Medication changes reported     No   Fall or balance concerns reported    No   Warm-up and Cool-down Performed on first and last piece of equipment   Resistance Training Performed Yes   VAD Patient? No     Pain Assessment   Currently in Pain? No/denies   Multiple Pain Sites No         History  Smoking Status  . Former Smoker  . Packs/day: 2.00  . Years: 30.00  Smokeless Tobacco  . Never Used    Comment: Quit 4 months ago  after restarting 10 years ago 1 pack a day    Goals Met:  Independence with exercise equipment Exercise tolerated well No report of cardiac concerns or symptoms Strength training completed today  Goals Unmet:  Not Applicable  Comments: Pt able to follow exercise prescription today without complaint.  Will continue to monitor for progression.    Dr. Emily Filbert is Medical Director for [place] and [facility].

## 2017-06-11 DIAGNOSIS — Z955 Presence of coronary angioplasty implant and graft: Secondary | ICD-10-CM | POA: Diagnosis not present

## 2017-06-11 DIAGNOSIS — Z48812 Encounter for surgical aftercare following surgery on the circulatory system: Secondary | ICD-10-CM | POA: Diagnosis not present

## 2017-06-11 NOTE — Progress Notes (Signed)
Daily Session Note  Patient Details  Name: HOSTEEN KIENAST MRN: [id number] Date of Birth: [birth year] Referring Provider:     Cardiac Rehab from 05/05/2017 in [facility] Cardiac and Pulmonary Rehab  Referring Provider  Bartholome Bill MD      Encounter Date: 06/11/2017  Check In:     Session Check In - 06/11/17 0811      Check-In   Location [facility]-Cardiac & Pulmonary Rehab   Staff Present Alberteen Sam, MA, ACSM RCEP, Exercise Physiologist;Susanne Bice, RN, BSN, CCRP;Corrina Steffensen Flavia Shipper   Supervising physician immediately available to respond to emergencies See telemetry face sheet for immediately available ER MD   Medication changes reported     No   Fall or balance concerns reported    No   Warm-up and Cool-down Performed on first and last piece of equipment   Resistance Training Performed Yes   VAD Patient? No     Pain Assessment   Currently in Pain? No/denies   Multiple Pain Sites No         History  Smoking Status  . Former Smoker  . Packs/day: 2.00  . Years: 30.00  Smokeless Tobacco  . Never Used    Comment: Quit 4 months ago  after restarting 10 years ago 1 pack a day    Goals Met:  Independence with exercise equipment Exercise tolerated well No report of cardiac concerns or symptoms Strength training completed today  Goals Unmet:  Not Applicable  Comments: Pt able to follow exercise prescription today without complaint.  Will continue to monitor for progression.   Dr. Emily Filbert is Medical Director for [place] and [facility].

## 2017-06-13 ENCOUNTER — Encounter: Payer: Medicare HMO | Admitting: *Deleted

## 2017-06-13 DIAGNOSIS — Z48812 Encounter for surgical aftercare following surgery on the circulatory system: Secondary | ICD-10-CM | POA: Diagnosis not present

## 2017-06-13 DIAGNOSIS — Z955 Presence of coronary angioplasty implant and graft: Secondary | ICD-10-CM

## 2017-06-13 NOTE — Progress Notes (Signed)
Daily Session Note  Patient Details  Name: Antonio Barnes MRN: [id number] Date of Birth: [birth year] Referring Provider:     Cardiac Rehab from 05/05/2017 in [facility] Cardiac and Pulmonary Rehab  Referring Provider  Bartholome Bill MD      Encounter Date: 06/13/2017  Check In:     Session Check In - 06/13/17 0814      Check-In   Location [facility]-Cardiac & Pulmonary Rehab   Staff Present Gerlene Burdock, RN, Geralyn Corwin, RN Vickki Hearing, BA, ACSM CEP, Exercise Physiologist  [place], [place] EP   Supervising physician immediately available to respond to emergencies See telemetry face sheet for immediately available ER MD   Medication changes reported     No   Fall or balance concerns reported    No   Tobacco Cessation No Change   Warm-up and Cool-down Performed on first and last piece of equipment   Resistance Training Performed Yes   VAD Patient? No     Pain Assessment   Currently in Pain? No/denies   Multiple Pain Sites No         History  Smoking Status  . Former Smoker  . Packs/day: 2.00  . Years: 30.00  Smokeless Tobacco  . Never Used    Comment: Quit 4 months ago  after restarting 10 years ago 1 pack a day    Goals Met:  Independence with exercise equipment Exercise tolerated well Personal goals reviewed No report of cardiac concerns or symptoms Strength training completed today  Goals Unmet:  Not Applicable  Comments: Pt able to follow exercise prescription today without complaint.  Will continue to monitor for progression.    Dr. Emily Filbert is Medical Director for [place] and [facility].

## 2017-06-16 ENCOUNTER — Encounter: Payer: Medicare HMO | Attending: Cardiology | Admitting: *Deleted

## 2017-06-16 DIAGNOSIS — Z48812 Encounter for surgical aftercare following surgery on the circulatory system: Secondary | ICD-10-CM | POA: Insufficient documentation

## 2017-06-16 DIAGNOSIS — Z955 Presence of coronary angioplasty implant and graft: Secondary | ICD-10-CM | POA: Diagnosis not present

## 2017-06-16 NOTE — Progress Notes (Signed)
Daily Session Note  Patient Details  Name: Antonio Barnes MRN: [id number] Date of Birth: September 07, 1946 Referring Provider:     Cardiac Rehab from 05/05/2017 in [facility] Cardiac and Pulmonary Rehab  Referring Provider  Bartholome Bill MD      Encounter Date: 06/16/2017  Check In:     Session Check In - 06/16/17 0802      Check-In   Location [facility]-Cardiac & Pulmonary Rehab   Staff Present Alberteen Sam, MA, ACSM RCEP, Exercise Physiologist;Kelly Amedeo Plenty, BS, ACSM CEP, Exercise Physiologist;Meredith Sherryll Burger, RN BSN   Supervising physician immediately available to respond to emergencies See telemetry face sheet for immediately available ER MD   Medication changes reported     No   Fall or balance concerns reported    No   Warm-up and Cool-down Performed on first and last piece of equipment   Resistance Training Performed Yes   VAD Patient? No     Pain Assessment   Currently in Pain? No/denies   Multiple Pain Sites No         History  Smoking Status  . Former Smoker  . Packs/day: 2.00  . Years: 30.00  Smokeless Tobacco  . Never Used    Comment: Quit 4 months ago  after restarting 10 years ago 1 pack a day    Goals Met:  Independence with exercise equipment Exercise tolerated well No report of cardiac concerns or symptoms Strength training completed today  Goals Unmet:  Not Applicable  Comments: Pt able to follow exercise prescription today without complaint.  Will continue to monitor for progression.    Dr. Emily Filbert is Medical Director for [place] and [facility].

## 2017-06-18 ENCOUNTER — Encounter: Payer: Medicare HMO | Admitting: *Deleted

## 2017-06-18 DIAGNOSIS — Z48812 Encounter for surgical aftercare following surgery on the circulatory system: Secondary | ICD-10-CM | POA: Diagnosis not present

## 2017-06-18 DIAGNOSIS — Z955 Presence of coronary angioplasty implant and graft: Secondary | ICD-10-CM

## 2017-06-18 NOTE — Progress Notes (Signed)
Daily Session Note  Patient Details  Name: Antonio Barnes MRN: [id number] Date of Birth: [birth year] Referring Provider:     Cardiac Rehab from 05/05/2017 in [facility] Cardiac and Pulmonary Rehab  Referring Provider  Bartholome Bill MD      Encounter Date: 06/18/2017  Check In:     Session Check In - 06/18/17 0808      Check-In   Location [facility]-Cardiac & Pulmonary Rehab   Staff Present Nyoka Cowden, RN, BSN, Willette Pa, MA, ACSM RCEP, Exercise Physiologist;Joseph Flavia Shipper   Supervising physician immediately available to respond to emergencies See telemetry face sheet for immediately available ER MD   Medication changes reported     No   Fall or balance concerns reported    No   Warm-up and Cool-down Performed on first and last piece of equipment   Resistance Training Performed Yes   VAD Patient? No     Pain Assessment   Currently in Pain? No/denies   Multiple Pain Sites No         History  Smoking Status  . Former Smoker  . Packs/day: 2.00  . Years: 30.00  Smokeless Tobacco  . Never Used    Comment: Quit 4 months ago  after restarting 10 years ago 1 pack a day    Goals Met:  Independence with exercise equipment Exercise tolerated well No report of cardiac concerns or symptoms Strength training completed today  Goals Unmet:  Not Applicable  Comments: Pt able to follow exercise prescription today without complaint.  Will continue to monitor for progression.    Dr. Emily Filbert is Medical Director for [place] and [facility].

## 2017-06-20 DIAGNOSIS — Z955 Presence of coronary angioplasty implant and graft: Secondary | ICD-10-CM | POA: Diagnosis not present

## 2017-06-20 DIAGNOSIS — Z48812 Encounter for surgical aftercare following surgery on the circulatory system: Secondary | ICD-10-CM | POA: Diagnosis not present

## 2017-06-20 NOTE — Progress Notes (Signed)
Daily Session Note  Patient Details  Name: Antonio Barnes MRN: [id number] Date of Birth: [birth year] Referring Provider:     Cardiac Rehab from 05/05/2017 in [facility] Cardiac and Pulmonary Rehab  Referring Provider  Bartholome Bill MD      Encounter Date: 06/20/2017  Check In:     Session Check In - 06/20/17 0831      Check-In   Location [facility]-Cardiac & Pulmonary Rehab   Staff Present Gerlene Burdock, RN, BSN;Jessica Luan Pulling, MA, ACSM RCEP, Exercise Physiologist;Kya Mayfield Oletta Darter, BA, ACSM CEP, Exercise Physiologist   Supervising physician immediately available to respond to emergencies See telemetry face sheet for immediately available ER MD   Medication changes reported     No   Fall or balance concerns reported    No   Warm-up and Cool-down Performed on first and last piece of equipment   Resistance Training Performed Yes   VAD Patient? No     Pain Assessment   Currently in Pain? No/denies           Exercise Prescription Changes - 06/19/17 1000      Response to Exercise   Blood Pressure (Admit) 114/60   Blood Pressure (Exercise) 142/84   Blood Pressure (Exit) 112/60   Heart Rate (Admit) 82 bpm   Heart Rate (Exercise) 131 bpm   Heart Rate (Exit) 75 bpm   Rating of Perceived Exertion (Exercise) 12   Symptoms none   Duration Continue with 45 min of aerobic exercise without signs/symptoms of physical distress.   Intensity THRR unchanged     Progression   Progression Continue to progress workloads to maintain intensity without signs/symptoms of physical distress.   Average METs 3.36     Resistance Training   Training Prescription Yes   Weight 7 lbs   Reps 10-15     Interval Training   Interval Training No     Treadmill   MPH 3   Grade 2   Minutes 15   METs 4.12     Recumbant Bike   Level 9   Watts 50   Minutes 15   METs 3.57     T5 Nustep   Level 7   Minutes 15   METs 2.4     Home Exercise Plan   Plans to continue exercise at Home (comment)  walking,  Bowflex, and handweights   Frequency Add 1 additional day to program exercise sessions.   Initial Home Exercises Provided 05/16/17      History  Smoking Status  . Former Smoker  . Packs/day: 2.00  . Years: 30.00  Smokeless Tobacco  . Never Used    Comment: Quit 4 months ago  after restarting 10 years ago 1 pack a day    Goals Met:  Independence with exercise equipment Exercise tolerated well No report of cardiac concerns or symptoms Strength training completed today  Goals Unmet:  Not Applicable  Comments: Pt able to follow exercise prescription today without complaint.  Will continue to monitor for progression.    Dr. Emily Filbert is Medical Director for [place] and [facility].

## 2017-06-23 ENCOUNTER — Encounter: Payer: Medicare HMO | Admitting: *Deleted

## 2017-06-23 DIAGNOSIS — Z955 Presence of coronary angioplasty implant and graft: Secondary | ICD-10-CM | POA: Diagnosis not present

## 2017-06-23 DIAGNOSIS — Z48812 Encounter for surgical aftercare following surgery on the circulatory system: Secondary | ICD-10-CM | POA: Diagnosis not present

## 2017-06-23 NOTE — Progress Notes (Signed)
Daily Session Note  Patient Details  Name: AMONI MORALES MRN: [id number] Date of Birth: 1945-12-22 Referring Provider:     Cardiac Rehab from 05/05/2017 in [facility] Cardiac and Pulmonary Rehab  Referring Provider  Bartholome Bill MD      Encounter Date: 06/23/2017  Check In:     Session Check In - 06/23/17 0757      Check-In   Location [facility]-Cardiac & Pulmonary Rehab   Staff Present Earlean Shawl, BS, ACSM CEP, Exercise Physiologist;Carroll Enterkin, RN, Levie Heritage, MA, ACSM RCEP, Exercise Physiologist   Supervising physician immediately available to respond to emergencies See telemetry face sheet for immediately available ER MD   Medication changes reported     No   Fall or balance concerns reported    No   Warm-up and Cool-down Performed on first and last piece of equipment   Resistance Training Performed Yes   VAD Patient? No     Pain Assessment   Currently in Pain? No/denies   Multiple Pain Sites No         History  Smoking Status  . Former Smoker  . Packs/day: 2.00  . Years: 30.00  Smokeless Tobacco  . Never Used    Comment: Quit 4 months ago  after restarting 10 years ago 1 pack a day    Goals Met:  Independence with exercise equipment Exercise tolerated well No report of cardiac concerns or symptoms Strength training completed today  Goals Unmet:  Not Applicable  Comments: Pt able to follow exercise prescription today without complaint.  Will continue to monitor for progression.    Dr. Emily Filbert is Medical Director for [place] and [facility].

## 2017-06-25 ENCOUNTER — Encounter: Payer: Self-pay | Admitting: *Deleted

## 2017-06-25 DIAGNOSIS — Z48812 Encounter for surgical aftercare following surgery on the circulatory system: Secondary | ICD-10-CM | POA: Diagnosis not present

## 2017-06-25 DIAGNOSIS — Z955 Presence of coronary angioplasty implant and graft: Secondary | ICD-10-CM | POA: Diagnosis not present

## 2017-06-25 NOTE — Progress Notes (Signed)
Daily Session Note  Patient Details  Name: WES LEZOTTE MRN: [id number] Date of Birth: 1946-03-15 Referring Provider:     Cardiac Rehab from 05/05/2017 in [facility] Cardiac and Pulmonary Rehab  Referring Provider  Bartholome Bill MD      Encounter Date: 06/25/2017  Check In:     Session Check In - 06/25/17 0809      Check-In   Location [facility]-Cardiac & Pulmonary Rehab   Staff Present Nyoka Cowden, RN, BSN, Willette Pa, MA, ACSM RCEP, Exercise Physiologist;Joseph Flavia Shipper   Supervising physician immediately available to respond to emergencies See telemetry face sheet for immediately available ER MD   Medication changes reported     No   Fall or balance concerns reported    No   Warm-up and Cool-down Performed on first and last piece of equipment   Resistance Training Performed Yes   VAD Patient? No     Pain Assessment   Currently in Pain? No/denies   Multiple Pain Sites No         History  Smoking Status  . Former Smoker  . Packs/day: 2.00  . Years: 30.00  Smokeless Tobacco  . Never Used    Comment: Quit 4 months ago  after restarting 10 years ago 1 pack a day    Goals Met:  Independence with exercise equipment Exercise tolerated well No report of cardiac concerns or symptoms Strength training completed today  Goals Unmet:  Not Applicable  Comments: Pt able to follow exercise prescription today without complaint.  Will continue to monitor for progression.   Dr. Emily Filbert is Medical Director for [place] and [facility].

## 2017-06-25 NOTE — Progress Notes (Signed)
Cardiac Individual Treatment Plan  Patient Details  Name: Antonio Barnes MRN: [id number] Date of Birth: 24-Feb-1946 Referring Provider:     Cardiac Rehab from 05/05/2017 in [facility] Cardiac and Pulmonary Rehab  Referring Provider  Bartholome Bill MD      Initial Encounter Date:    Cardiac Rehab from 05/05/2017 in [facility] Cardiac and Pulmonary Rehab  Date  05/05/17  Referring Provider  Bartholome Bill MD      Visit Diagnosis: No diagnosis found.  Patient's Home Medications on Admission:  Current Outpatient Prescriptions:  .  Adalimumab (HUMIRA) 40 MG/0.8ML PSKT, Inject 40 mg into the skin. Every 2 weeks as needed for psoriasis, Disp: , Rfl:  .  albuterol (PROVENTIL HFA;VENTOLIN HFA) 108 (90 Base) MCG/ACT inhaler, Inhale 2 puffs into the lungs every 6 (six) hours as needed for wheezing or shortness of breath., Disp: , Rfl:  .  amLODipine-benazepril (LOTREL) 5-20 MG per capsule, Take 1 capsule by mouth daily., Disp: , Rfl:  .  aspirin EC 81 MG tablet, Take 81 mg by mouth daily., Disp: , Rfl:  .  betamethasone dipropionate (DIPROLENE) 0.05 % cream, Apply 1 application topically daily as needed (psoriasis). , Disp: , Rfl:  .  carvedilol (COREG) 25 MG tablet, Take 12.5 mg by mouth 2 (two) times daily with a meal. , Disp: , Rfl:  .  clopidogrel (PLAVIX) 75 MG tablet, Take 1 tablet (75 mg total) by mouth daily with breakfast., Disp: 30 tablet, Rfl: 11 .  ezetimibe (ZETIA) 10 MG tablet, Take 10 mg by mouth daily., Disp: , Rfl:  .  fexofenadine (WAL-FEX) 180 MG tablet, Take 180 mg by mouth daily as needed for allergies or rhinitis., Disp: , Rfl:  .  hydrochlorothiazide (HYDRODIURIL) 25 MG tablet, Take 25 mg by mouth daily., Disp: , Rfl:  .  levothyroxine (SYNTHROID, LEVOTHROID) 75 MCG tablet, Take 75 mcg by mouth daily before breakfast., Disp: , Rfl:  .  naproxen sodium (ANAPROX) 220 MG tablet, Take 440 mg by mouth daily as needed (pain). , Disp: , Rfl:  .  sildenafil (REVATIO) 20 MG tablet, Take 40-100 mg  by mouth daily as needed (erectile dysfunction). , Disp: , Rfl:   Past Medical History: Past Medical History:  Diagnosis Date  . Asthma   . Diabetes mellitus without complication ([place])   . Hypertension   . Myocardial infarct ([place])   . Prostate cancer ([place])     Tobacco Use: History  Smoking Status  . Former Smoker  . Packs/day: 2.00  . Years: 30.00  Smokeless Tobacco  . Never Used    Comment: Quit 4 months ago  after restarting 10 years ago 1 pack a day    Labs: Recent Review Flowsheet Data    There is no flowsheet data to display.       Exercise Target Goals:    Exercise Program Goal: Individual exercise prescription set with THRR, safety & activity barriers. Participant demonstrates ability to understand and report RPE using BORG scale, to self-measure pulse accurately, and to acknowledge the importance of the exercise prescription.  Exercise Prescription Goal: Starting with aerobic activity 30 plus minutes a day, 3 days per week for initial exercise prescription. Provide home exercise prescription and guidelines that participant acknowledges understanding prior to discharge.  Activity Barriers & Risk Stratification:     Activity Barriers & Cardiac Risk Stratification - 05/05/17 1343      Activity Barriers & Cardiac Risk Stratification   Activity Barriers Joint Problems;Deconditioning;Muscular Weakness;Shortness of Breath  Right hip  - standing or moving can cause pain in hip and down his leg. 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 HR 108 bpm     Max Ex. BP 132/64     2 Minute Post BP 122/60        Oxygen Initial Assessment:   Oxygen Re-Evaluation:   Oxygen Discharge (Final Oxygen Re-Evaluation):   Initial Exercise Prescription:     Initial Exercise Prescription - 05/05/17 1400      Date of Initial Exercise RX and Referring Provider   Date 05/05/17   Referring Provider Bartholome Bill MD     Treadmill   MPH 2.9   Grade 0.5   Minutes 15   METs 3.42     Recumbant Bike   Level 3   RPM 35   Watts 50   Minutes 15   METs 3.5     T5 Nustep   Level 3   SPM 80   Minutes 15   METs 3     Prescription Details   Frequency (times per week) 3   Duration Progress to 45 minutes of aerobic exercise without signs/symptoms of physical distress     Intensity   THRR 40-80% of Max Heartrate 93-132   Ratings of Perceived Exertion 11-13   Perceived Dyspnea 0-4     Progression   Progression Continue to progress workloads to maintain intensity without signs/symptoms of physical distress.     Resistance Training   Training Prescription Yes   Weight 4 lbs   Reps 10-15      Perform Capillary Blood Glucose checks as needed.  Exercise Prescription Changes:     Exercise Prescription Changes    Row Name 05/05/17 1300 05/16/17 0800 05/21/17 1500 06/04/17 1500 06/19/17 1000     Response to Exercise   Blood Pressure (Admit) 130/74  - 118/74 124/64 114/60   Blood Pressure (Exercise) 132/64  - 130/72 124/74 142/84   Blood Pressure (Exit) 122/60  - 124/76 126/60 112/60   Heart Rate (Admit) 53 bpm  - 64 bpm 67 bpm 82 bpm   Heart Rate (Exercise) 108 bpm  - 95 bpm 115 bpm 131 bpm   Heart Rate (Exit) 68 bpm  - 62 bpm 72 bpm 75 bpm   Oxygen Saturation (Admit) 99 %  -  -  -  -   Oxygen Saturation (Exercise) 99 %  -  -  -  -   Rating of Perceived Exertion (Exercise) 12  - _0 Perceived Dyspnea (Exercise) 2  -  -  -  -   Symptoms SOB  - none none none   Comments walk test results  -  -  -  -  Duration  -  - Continue with 45 min of aerobic exercise without signs/symptoms of physical distress. Continue with 45 min of aerobic exercise without signs/symptoms of physical distress. Continue with 45 min of aerobic exercise without signs/symptoms of physical distress.   Intensity  -  - THRR unchanged THRR unchanged THRR unchanged     Progression   Progression  -  - Continue to progress workloads to maintain intensity without signs/symptoms of physical distress. Continue to progress workloads to maintain intensity without signs/symptoms of physical distress. Continue to progress workloads to maintain intensity without signs/symptoms of physical distress.   Average METs  -  - 3.46 3.3 3.36     Resistance Training   Training Prescription  -  - Yes Yes Yes   Weight  -  - 4 lbs 5 lbs 7 lbs   Reps  -  - 10-15 10-15 10-15     Interval Training   Interval Training  -  - No No No     Treadmill   MPH  -  - _0 Grade  -  - _1 Minutes  -  - _2 METs  -  - 4.12 4.12 4.12     Recumbant Bike   Level  -  - _3 Watts  -  - 64 50 50   Minutes  -  - _4 METs  -  - 4.05 3.57 3.57     T5 Nustep   Level  -  - _5 Minutes  -  - _6 METs  -  - 2.2 2.2 2.4     Home Exercise Plan   Plans to continue exercise at  - Home (comment)  walking, Bowflex, and handweights Home (comment)  walking, Bowflex, and handweights Home (comment)  walking, Bowflex, and handweights Home (comment)  walking, Bowflex, and handweights   Frequency  - Add 1 additional day to program exercise sessions. Add 1 additional day to program exercise sessions. Add 1 additional day to program exercise sessions. Add 1 additional day to program exercise sessions.   Initial Home Exercises Provided  - 05/16/17 05/16/17 05/16/17 05/16/17      Exercise Comments:     Exercise Comments    Row Name 05/09/17 0854 05/23/17 0813         Exercise Comments  First full day of exercise!  Patient was  oriented to gym and equipment including functions, settings, policies, and procedures.  Patient's individual exercise prescription and treatment plan were reviewed.  All starting workloads were established based on the results of the 6 minute walk test done at initial orientation visit.  The plan for exercise progression was also introduced and progression will be customized based on patient's performance and goals. Started interval training today!         Exercise Goals and Review:     Exercise Goals    Row Name 05/05/17 1415             Exercise Goals   Increase Physical Activity Yes       Intervention Provide advice, education, support and counseling about physical activity/exercise needs.;Develop an individualized exercise prescription for aerobic and resistive training based on initial evaluation findings, risk stratification, comorbidities and participant's personal goals.       Expected Outcomes Achievement of increased cardiorespiratory fitness and enhanced flexibility, muscular endurance and  strength shown through measurements of functional capacity and personal statement of participant.       Increase Strength and Stamina Yes       Intervention Provide advice, education, support and counseling about physical activity/exercise needs.;Develop an individualized exercise prescription for aerobic and resistive training based on initial evaluation findings, risk stratification, comorbidities and participant's personal goals.       Expected Outcomes Achievement of increased cardiorespiratory fitness and enhanced flexibility, muscular endurance and strength shown through measurements of functional capacity and personal statement of participant.          Exercise Goals Re-Evaluation :     Exercise Goals Re-Evaluation    Row Name 05/16/17 9323 05/21/17 1525 06/04/17 1509 06/19/17 1032       Exercise Goal Re-Evaluation   Exercise Goals Review Increase Physical Activity;Increase  Strength and Stamina;Able to understand and use rate of perceived exertion (RPE) scale;Knowledge and understanding of Target Heart Rate Range (THRR);Able to check pulse independently;Understanding of Exercise Prescription Increase Strength and Stamina;Increase Physical Activity Increase Strength and Stamina;Increase Physical Activity Increase Strength and Stamina;Increase Physical Activity    Comments Reviewed home exercise with pt today.  Pt plans to walk, bowflex and handweights for exercise.  Reviewed THR, pulse, RPE, sign and symptoms, NTG use, and when to call 911 or MD.  Also discussed weather considerations and indoor options.  Pt voiced understanding. Ed is off to a good start in rehab.   He is already up to 64 watts on the recumbent bike.  We will talk about possibly adding in intervals to prescription.  We will continue to monitor his progression. Ed continues to do well in rehab.  He is now up to level 9 on the recumbent bike.  We will continue to monitor his progression.  Ed has been doing well in rehab.  He is up to level 7 on the T5 NuStep.  He prefers the higher workloads versus intervals.  We will continue to monitor his progress.     Expected Outcomes Short - Add one day of exercise outside class times Long - maintain independent exercise   Short: Add in intervals.  Long: Make exercise part of routine.  Short: Talk about intervals again.  Long: Continue to exercise regularly. Short: Continue to work hard in rehab.  Long: Continue to exercise regularly.        Discharge Exercise Prescription (Final Exercise Prescription Changes):     Exercise Prescription Changes - 06/19/17 1000      Response to Exercise   Blood Pressure (Admit) 114/60   Blood Pressure (Exercise) 142/84   Blood Pressure (Exit) 112/60   Heart Rate (Admit) 82 bpm   Heart Rate (Exercise) 131 bpm   Heart Rate (Exit) 75 bpm   Rating of Perceived Exertion (Exercise) 12   Symptoms none   Duration Continue with 45 min of  aerobic exercise without signs/symptoms of physical distress.   Intensity THRR unchanged     Progression   Progression Continue to progress workloads to maintain intensity without signs/symptoms of physical distress.   Average METs 3.36     Resistance Training   Training Prescription Yes   Weight 7 lbs   Reps 10-15     Interval Training   Interval Training No     Treadmill   MPH 3   Grade 2   Minutes 15   METs 4.12     Recumbant Bike   Level 9   Watts 50   Minutes 15  METs 3.57     T5 Nustep   Level 7   Minutes 15   METs 2.4     Home Exercise Plan   Plans to continue exercise at Home (comment)  walking, Bowflex, and handweights   Frequency Add 1 additional day to program exercise sessions.   Initial Home Exercises Provided 05/16/17      Nutrition:  Target Goals: Understanding of nutrition guidelines, daily intake of sodium <1544m, cholesterol <203m calories 30% from fat and 7% or less from saturated fats, daily to have 5 or more servings of fruits and vegetables.  Biometrics:     Pre Biometrics - 05/05/17 1416      Pre Biometrics   Height 6' 2" (1.88 m)   Weight 222 lb 12.8 oz (101.1 kg)   Waist Circumference 44.5 inches   Hip Circumference 39.5 inches   Waist to Hip Ratio 1.13 %   BMI (Calculated) 28.7   Single Leg Stand 20.07 seconds       Nutrition Therapy Plan and Nutrition Goals:     Nutrition Therapy & Goals - 05/26/17 1428      Nutrition Therapy   Diet Instructed on a meal plan based on heart healthy dietary guidelines and including diabetes dietary guidelines.   Protein (specify units) 8   Fiber 30 grams   Whole Grain Foods 3 servings   Saturated Fats 14 max. grams   Fruits and Vegetables 5 servings/day   Sodium 2000 grams  1500 mg-ideal     Personal Nutrition Goals   Nutrition Goal Pour water off canned vegetables and replace with fresh water to reduce sodium.   Personal Goal #2 Decrease added salt. Try Morton lite salt and  limit its use.   Personal Goal #3 If choose one meal that is high in fat, try to make lower fat choices at other meals that day.   Personal Goal #4 Switch to unsweetened tea added with Splenda or Sweet 'n low.     Intervention Plan   Intervention Prescribe, educate and counsel regarding individualized specific dietary modifications aiming towards targeted core components such as weight, hypertension, lipid management, diabetes, heart failure and other comorbidities.;Nutrition handout(s) given to patient.   Expected Outcomes Short Term Goal: Understand basic principles of dietary content, such as calories, fat, sodium, cholesterol and nutrients.;Short Term Goal: A plan has been developed with personal nutrition goals set during dietitian appointment.;Long Term Goal: Adherence to prescribed nutrition plan.      Nutrition Discharge: Rate Your Plate Scores:     Nutrition Assessments - 05/05/17 1346      MEDFICTS Scores   Pre Score 140      Nutrition Goals Re-Evaluation:     Nutrition Goals Re-Evaluation    Row Name 06/13/17 0930 06/25/17 [id number]         Goals   Comment Ed met with the RD but has not started any of her suggestions.  He eats out a lot and admits to eating late at night.    -      Expected Outcome Short - Ed will drink more water and leave off eating wings in the next week.  Long - Ed will make small changes to his diet to work towards a healthier diet overall. Heart healthier eating        Personal Goal #2 Re-Evaluation   Personal Goal #2  - Ed is trying to decrease his salt intake        Personal Goal #3  Re-Evaluation   Personal Goal #3  - Prosper "Ed" is trying to eat somewhat healthier        Personal Goal #4 Re-Evaluation   Personal Goal #4  - is trying to switch          Nutrition Goals Discharge (Final Nutrition Goals Re-Evaluation):     Nutrition Goals Re-Evaluation - 06/25/17 0619      Goals   Expected Outcome Heart healthier eating     Personal  Goal #2 Re-Evaluation   Personal Goal #2 Ed is trying to decrease his salt intake     Personal Goal #3 Re-Evaluation   Personal Goal #3 Dev[place] "Ed" is trying to eat somewhat healthier     Personal Goal #4 Re-Evaluation   Personal Goal #4 is trying to switch       Psychosocial: Target Goals: Acknowledge presence or absence of significant depression and/or stress, maximize coping skills, provide positive support system. Participant is able to verbalize types and ability to use techniques and skills needed for reducing stress and depression.   Initial Review & Psychosocial Screening:     Initial Psych Review & Screening - 05/05/17 1413      Initial Review   Current issues with None Identified     Family Dynamics   Good Support System? Yes  children        Barriers   Psychosocial barriers to participate in program There are no identifiable barriers or psychosocial needs.;The patient should benefit from training in stress management and relaxation.     Screening Interventions   Interventions Encouraged to exercise;Provide feedback about the scores to participant;To provide support and resources with identified psychosocial needs      Quality of Life Scores:      Quality of Life - 05/05/17 1415      Quality of Life Scores   Health/Function Pre 23.23 %   Socioeconomic Pre 26.79 %   Psych/Spiritual Pre 23.64 %   Family Pre 26.9 %   GLOBAL Pre 24.59 %      PHQ-9: Recent Review Flowsheet Data    Depression screen [facility] 2/9 05/05/2017   Decreased Interest 1   Down, Depressed, Hopeless 0   PHQ - 2 Score 1   Altered sleeping 1   Tired, decreased energy 1   Change in appetite 2   Feeling bad or failure about yourself  0   Trouble concentrating 0   Moving slowly or fidgety/restless 0   Suicidal thoughts 0   PHQ-9 Score 5   Difficult doing work/chores Not difficult at all     Interpretation of Total Score  Total Score Depression Severity:  1-4 = Minimal depression, 5-9  = Mild depression, 10-14 = Moderate depression, 15-19 = Moderately severe depression, 20-27 = Severe depression   Psychosocial Evaluation and Intervention:     Psychosocial Evaluation - 05/21/17 0928      Psychosocial Evaluation & Interventions   Interventions Encouraged to exercise with the program and follow exercise prescription   Comments Counselor met with Mr. Reitter (Ed) today for initial psychosocial evaluation.  He is a 71 year old who had a stent inserted several weeks ago and was encouraged to come to this program by his Dr.  He has a strong support system with a son and daughter who live close by.  Ed has minimal health issues and reports sleeping well and eating "too good."  He denies a history of depression or anxiety or any current symptoms and has minimal  stress in his life at this time.  He has goals to lose weight and get in the habit of exercising more consistently.  Counselor discussed some options for Ed to consider once he completes this program to continue this positive habit.  Staff will follow with Ed throughout the course of this program.    Expected Outcomes Ed will benefit from consistent exercise to achieve his stated goals.  The educational and psychoeducational components of this program will be helpful in understanding and coping with his illness and improving his eating habits.  Ed will see the dietician to address his weight loss goals.     Continue Psychosocial Services  Follow up required by staff      Psychosocial Re-Evaluation:     Psychosocial Re-Evaluation    [facility] Name 06/13/17 0932             Psychosocial Re-Evaluation   Comments Ed doesn't report any new concerns.       Expected Outcomes Short - Ed will continue to exercise.  Long - Ed will complete HT and use the stress management skills to maintain his low level of stress.            Psychosocial Discharge (Final Psychosocial Re-Evaluation):     Psychosocial Re-Evaluation - 06/13/17 0932       Psychosocial Re-Evaluation   Comments Ed doesn't report any new concerns.   Expected Outcomes Short - Ed will continue to exercise.  Long - Ed will complete HT and use the stress management skills to maintain his low level of stress.        Vocational Rehabilitation: Provide vocational rehab assistance to qualifying candidates.   Vocational Rehab Evaluation & Intervention:     Vocational Rehab - 05/05/17 1420      Initial Vocational Rehab Evaluation & Intervention   Assessment shows need for Vocational Rehabilitation No      Education: Education Goals: Education classes will be provided on a variety of topics geared toward better understanding of heart health and risk factor modification. Participant will state understanding/return demonstration of topics presented as noted by education test scores.  Learning Barriers/Preferences:     Learning Barriers/Preferences - 05/05/17 1417      Learning Barriers/Preferences   Learning Barriers Hearing  Does not have hearing aids   Learning Preferences None      Education Topics: General Nutrition Guidelines/Fats and Fiber: -Group instruction provided by verbal, written material, models and posters to present the general guidelines for heart healthy nutrition. Gives an explanation and review of dietary fats and fiber.   Controlling Sodium/Reading Food Labels: -Group verbal and written material supporting the discussion of sodium use in heart healthy nutrition. Review and explanation with models, verbal and written materials for utilization of the food label.   Cardiac Rehab from 06/23/2017 in [facility] Cardiac and Pulmonary Rehab  Date  05/12/17  Educator  CR  Instruction Review Code  1- Verbalizes Understanding      Exercise Physiology & Risk Factors: - Group verbal and written instruction with models to review the exercise physiology of the cardiovascular system and associated critical values. Details cardiovascular disease risk  factors and the goals associated with each risk factor.   Cardiac Rehab from 06/23/2017 in [facility] Cardiac and Pulmonary Rehab  Date  05/21/17  Educator  [facility]  Instruction Review Code  1- Verbalizes Understanding      Aerobic Exercise & Resistance Training: - Gives group verbal and written discussion on the health impact of  inactivity. On the components of aerobic and resistive training programs and the benefits of this training and how to safely progress through these programs.   Cardiac Rehab from 06/23/2017 in [facility] Cardiac and Pulmonary Rehab  Date  05/26/17  Educator  [facility]  Instruction Review Code  1- Verbalizes Understanding      Flexibility, Balance, General Exercise Guidelines: - Provides group verbal and written instruction on the benefits of flexibility and balance training programs. Provides general exercise guidelines with specific guidelines to those with heart or lung disease. Demonstration and skill practice provided.   Cardiac Rehab from 06/23/2017 in [facility] Cardiac and Pulmonary Rehab  Date  05/28/17  Educator  [facility]  Instruction Review Code  1- Verbalizes Understanding      Stress Management: - Provides group verbal and written instruction about the health risks of elevated stress, cause of high stress, and healthy ways to reduce stress.   Depression: - Provides group verbal and written instruction on the correlation between heart/lung disease and depressed mood, treatment options, and the stigmas associated with seeking treatment.   Anatomy & Physiology of the Heart: - Group verbal and written instruction and models provide basic cardiac anatomy and physiology, with the coronary electrical and arterial systems. Review of: AMI, Angina, Valve disease, Heart Failure, Cardiac Arrhythmia, Pacemakers, and the ICD.   Cardiac Procedures: - Group verbal and written instruction to review commonly prescribed medications for heart disease. Reviews the medication, class of the drug, and  side effects. Includes the steps to properly store meds and maintain the prescription regimen. (beta blockers and nitrates)   Cardiac Rehab from 06/23/2017 in [facility] Cardiac and Pulmonary Rehab  Date  06/09/17  Educator  CE  Instruction Review Code  1- Verbalizes Understanding      Cardiac Medications I: - Group verbal and written instruction to review commonly prescribed medications for heart disease. Reviews the medication, class of the drug, and side effects. Includes the steps to properly store meds and maintain the prescription regimen.   Cardiac Rehab from 06/23/2017 in [facility] Cardiac and Pulmonary Rehab  Date  06/16/17  Educator  [facility]  Instruction Review Code  1- Verbalizes Understanding      Cardiac Medications II: -Group verbal and written instruction to review commonly prescribed medications for heart disease. Reviews the medication, class of the drug, and side effects. (all other drug classes)    Go Sex-Intimacy & Heart Disease, Get SMART - Goal Setting: - Group verbal and written instruction through game format to discuss heart disease and the return to sexual intimacy. Provides group verbal and written material to discuss and apply goal setting through the application of the S.M.A.R.T. Method.   Cardiac Rehab from 06/23/2017 in [facility] Cardiac and Pulmonary Rehab  Date  06/09/17  Educator  CE  Instruction Review Code  1- Verbalizes Understanding      Other Matters of the Heart: - Provides group verbal, written materials and models to describe Heart Failure, Angina, Valve Disease, Peripheral Artery Disease, and Diabetes in the realm of heart disease. Includes description of the disease process and treatment options available to the cardiac patient.   Exercise & Equipment Safety: - Individual verbal instruction and demonstration of equipment use and safety with use of the equipment.   Cardiac Rehab from 06/23/2017 in [facility] Cardiac and Pulmonary Rehab  Date  05/05/17  Educator  SB   Instruction Review Code  1- Verbalizes Understanding      Infection Prevention: - Provides verbal and written material  to individual with discussion of infection control including proper hand washing and proper equipment cleaning during exercise session.   Cardiac Rehab from 06/23/2017 in [facility] Cardiac and Pulmonary Rehab  Date  05/05/17  Educator  SB  Instruction Review Code  1- Verbalizes Understanding      Falls Prevention: - Provides verbal and written material to individual with discussion of falls prevention and safety.   Cardiac Rehab from 06/23/2017 in [facility] Cardiac and Pulmonary Rehab  Date  05/05/17  Educator  SB  Instruction Review Code (retired)  2- meets goals/outcomes  Instruction Review Code  1- Science writer Understanding      Diabetes: - Individual verbal and written instruction to review signs/symptoms of diabetes, desired ranges of glucose level fasting, after meals and with exercise. Acknowledge that pre and post exercise glucose checks will be done for 3 sessions at entry of program.   Other: -Provides group and verbal instruction on various topics (see comments)    Knowledge Questionnaire Score:     Knowledge Questionnaire Score - 05/05/17 1417      Knowledge Questionnaire Score   Pre Score 16/28  Reviewed corrct responses with Ed today.  He verbalized understanding of the correct responses      Core Components/Risk Factors/Patient Goals at Admission:     Personal Goals and Risk Factors at Admission - 05/05/17 1410      Core Components/Risk Factors/Patient Goals on Admission    Weight Management Yes;Obesity   Intervention Weight Management: Develop a combined nutrition and exercise program designed to reach desired caloric intake, while maintaining appropriate intake of nutrient and fiber, sodium and fats, and appropriate energy expenditure required for the weight goal.;Weight Management: Provide education and appropriate resources to help  participant work on and attain dietary goals.;Weight Management/Obesity: Establish reasonable short term and long term weight goals.;Obesity: Provide education and appropriate resources to help participant work on and attain dietary goals.   Admit Weight 222 lb 12.8 oz (101.1 kg)   Goal Weight: Short Term 220 lb (99.8 kg)   Goal Weight: Long Term 200 lb (90.7 kg)   Expected Outcomes Short Term: Continue to assess and modify interventions until short term weight is achieved;Long Term: Adherence to nutrition and physical activity/exercise program aimed toward attainment of established weight goal;Weight Loss: Understanding of general recommendations for a balanced deficit meal plan, which promotes 1-2 lb weight loss per week and includes a negative energy balance of [id number] kcal/d   Tobacco Cessation Yes  Quit 4 months ago   Number of packs per day 1   Intervention Assist the participant in steps to quit. Provide individualized education and counseling about committing to Tobacco Cessation, relapse prevention, and pharmacological support that can be provided by physician.;Advice worker, assist with locating and accessing local/national Quit Smoking programs, and support quit date choice.   Expected Outcomes Short Term: Will demonstrate readiness to quit, by selecting a quit date.;Long Term: Complete abstinence from all tobacco products for at least 12 months from quit date.;Short Term: Will quit all tobacco product use, adhering to prevention of relapse plan.   Hypertension Yes   Intervention Provide education on lifestyle modifcations including regular physical activity/exercise, weight management, moderate sodium restriction and increased consumption of fresh fruit, vegetables, and low fat dairy, alcohol moderation, and smoking cessation.;Monitor prescription use compliance.   Expected Outcomes Short Term: Continued assessment and intervention until BP is < 140/80m HG in hypertensive  participants. < 130/857mHG in hypertensive participants with diabetes, heart failure or chronic  kidney disease.;Long Term: Maintenance of blood pressure at goal levels.   Lipids Yes   Intervention Provide education and support for participant on nutrition & aerobic/resistive exercise along with prescribed medications to achieve LDL <80m, HDL >430m   Expected Outcomes Short Term: Participant states understanding of desired cholesterol values and is compliant with medications prescribed. Participant is following exercise prescription and nutrition guidelines.;Long Term: Cholesterol controlled with medications as prescribed, with individualized exercise RX and with personalized nutrition plan. Value goals: LDL < 7020mHDL > 40 mg.      Core Components/Risk Factors/Patient Goals Review:      Goals and Risk Factor Review    Row Name 06/13/17 0927             Core Components/Risk Factors/Patient Goals Review   Personal Goals Review Weight Management/Obesity;Hypertension;Lipids       Review Ed is maintaining his current weight.  He has met with the dietician.  He is taking all medications as directed.  His BP is still a little higher than ideal.       Expected Outcomes Short - Ed will coontinue to exercise and take meds as directed.  Long - Ed will work with his Dr to get mubers at optimal levels.          Core Components/Risk Factors/Patient Goals at Discharge (Final Review):      Goals and Risk Factor Review - 06/13/17 0927      Core Components/Risk Factors/Patient Goals Review   Personal Goals Review Weight Management/Obesity;Hypertension;Lipids   Review Ed is maintaining his current weight.  He has met with the dietician.  He is taking all medications as directed.  His BP is still a little higher than ideal.   Expected Outcomes Short - Ed will coontinue to exercise and take meds as directed.  Long - Ed will work with his Dr to get mubers at optimal levels.      ITP Comments:      ITP Comments    Row Name 05/05/17 1334 05/28/17 1053 06/25/17 0621       ITP Comments Medical review completed.  Initial ITP created and ready for review and changes if indicates by [facility].  [facility]. Continue with ITP unless directed changes per Medical Director review.   30 day review. Continue with ITP unless directed changes per Medical Director review.          Comments:

## 2017-06-27 ENCOUNTER — Encounter: Payer: Medicare HMO | Admitting: *Deleted

## 2017-06-27 DIAGNOSIS — Z48812 Encounter for surgical aftercare following surgery on the circulatory system: Secondary | ICD-10-CM | POA: Diagnosis not present

## 2017-06-27 DIAGNOSIS — Z955 Presence of coronary angioplasty implant and graft: Secondary | ICD-10-CM | POA: Diagnosis not present

## 2017-06-27 NOTE — Progress Notes (Signed)
Daily Session Note  Patient Details  Name: Antonio Barnes MRN: [id number] Date of Birth: Aug 01, 1946 Referring Provider:     Cardiac Rehab from 05/05/2017 in [facility] Cardiac and Pulmonary Rehab  Referring Provider  Bartholome Bill MD      Encounter Date: 06/27/2017  Check In:     Session Check In - 06/27/17 0810      Check-In   Location [facility]-Cardiac & Pulmonary Rehab   Staff Present Gerlene Burdock, RN, BSN;Marshall Roehrich Luan Pulling, MA, ACSM RCEP, Exercise Physiologist;Amanda Oletta Darter, BA, ACSM CEP, Exercise Physiologist   Supervising physician immediately available to respond to emergencies See telemetry face sheet for immediately available ER MD   Medication changes reported     No   Fall or balance concerns reported    No   Warm-up and Cool-down Performed on first and last piece of equipment   Resistance Training Performed No  left early   VAD Patient? No     Pain Assessment   Currently in Pain? No/denies   Multiple Pain Sites No         History  Smoking Status  . Former Smoker  . Packs/day: 2.00  . Years: 30.00  Smokeless Tobacco  . Never Used    Comment: Quit 4 months ago  after restarting 10 years ago 1 pack a day    Goals Met:  Independence with exercise equipment Exercise tolerated well No report of cardiac concerns or symptoms Strength training completed today  Goals Unmet:  Not Applicable  Comments: Pt able to follow exercise prescription today without complaint.  Will continue to monitor for progression. Ed left a early as he felt like he was coming down with a cold.  Dr. Emily Filbert is Medical Director for [place] and [facility].

## 2017-06-30 ENCOUNTER — Encounter: Payer: Medicare HMO | Admitting: *Deleted

## 2017-06-30 DIAGNOSIS — R739 Hyperglycemia, unspecified: Secondary | ICD-10-CM | POA: Diagnosis not present

## 2017-06-30 DIAGNOSIS — Z955 Presence of coronary angioplasty implant and graft: Secondary | ICD-10-CM | POA: Diagnosis not present

## 2017-06-30 DIAGNOSIS — Z48812 Encounter for surgical aftercare following surgery on the circulatory system: Secondary | ICD-10-CM | POA: Diagnosis not present

## 2017-06-30 NOTE — Progress Notes (Signed)
Daily Session Note  Patient Details  Name: Antonio Barnes MRN: [id number] Date of Birth: 12-May-1946 Referring Provider:     Cardiac Rehab from 05/05/2017 in [facility] Cardiac and Pulmonary Rehab  Referring Provider  Bartholome Bill MD      Encounter Date: 06/30/2017  Check In:     Session Check In - 06/30/17 0817      Check-In   Location [facility]-Cardiac & Pulmonary Rehab   Staff Present Earlean Shawl, BS, ACSM CEP, Exercise Physiologist;Carroll Enterkin, RN, BSN;Laureen Owens Shark, BS, RRT, Respiratory Therapist   Supervising physician immediately available to respond to emergencies See telemetry face sheet for immediately available ER MD   Medication changes reported     No   Fall or balance concerns reported    No   Tobacco Cessation No Change   Warm-up and Cool-down Performed on first and last piece of equipment   Resistance Training Performed Yes   VAD Patient? No     Pain Assessment   Currently in Pain? No/denies   Multiple Pain Sites No         History  Smoking Status  . Former Smoker  . Packs/day: 2.00  . Years: 30.00  Smokeless Tobacco  . Never Used    Comment: Quit 4 months ago  after restarting 10 years ago 1 pack a day    Goals Met:  Independence with exercise equipment Exercise tolerated well No report of cardiac concerns or symptoms Strength training completed today  Goals Unmet:  Not Applicable  Comments: Pt able to follow exercise prescription today without complaint.  Will continue to monitor for progression.    Dr. Emily Filbert is Medical Director for [place] and [facility].

## 2017-07-02 DIAGNOSIS — Z955 Presence of coronary angioplasty implant and graft: Secondary | ICD-10-CM | POA: Diagnosis not present

## 2017-07-02 DIAGNOSIS — Z48812 Encounter for surgical aftercare following surgery on the circulatory system: Secondary | ICD-10-CM | POA: Diagnosis not present

## 2017-07-02 NOTE — Progress Notes (Signed)
Daily Session Note  Patient Details  Name: Antonio Barnes MRN: [id number] Date of Birth: [birth year] Referring Provider:     Cardiac Rehab from 05/05/2017 in [facility] Cardiac and Pulmonary Rehab  Referring Provider  Bartholome Bill MD      Encounter Date: 07/02/2017  Check In:     Session Check In - 07/02/17 0739      Check-In   Location [facility]-Cardiac & Pulmonary Rehab   Staff Present Nada Maclachlan, BA, ACSM CEP, Exercise Physiologist;Krista Frederico Hamman, RN BSN;Joseph Flavia Shipper   Supervising physician immediately available to respond to emergencies See telemetry face sheet for immediately available ER MD   Medication changes reported     No   Fall or balance concerns reported    No   Warm-up and Cool-down Performed as group-led instruction   Resistance Training Performed Yes   VAD Patient? No     Pain Assessment   Currently in Pain? No/denies   Multiple Pain Sites No         History  Smoking Status  . Former Smoker  . Packs/day: 2.00  . Years: 30.00  Smokeless Tobacco  . Never Used    Comment: Quit 4 months ago  after restarting 10 years ago 1 pack a day    Goals Met:  Independence with exercise equipment Exercise tolerated well No report of cardiac concerns or symptoms Strength training completed today  Goals Unmet:  Not Applicable  Comments: Pt able to follow exercise prescription today without complaint.  Will continue to monitor for progression.   Dr. Emily Filbert is Medical Director for [place] and [facility].

## 2017-07-03 DIAGNOSIS — E039 Hypothyroidism, unspecified: Secondary | ICD-10-CM | POA: Diagnosis not present

## 2017-07-03 DIAGNOSIS — I2510 Atherosclerotic heart disease of native coronary artery without angina pectoris: Secondary | ICD-10-CM | POA: Diagnosis not present

## 2017-07-03 DIAGNOSIS — R739 Hyperglycemia, unspecified: Secondary | ICD-10-CM | POA: Diagnosis not present

## 2017-07-04 ENCOUNTER — Encounter: Payer: Medicare HMO | Admitting: *Deleted

## 2017-07-04 VITALS — Ht 74.0 in | Wt 219.0 lb

## 2017-07-04 DIAGNOSIS — Z48812 Encounter for surgical aftercare following surgery on the circulatory system: Secondary | ICD-10-CM | POA: Diagnosis not present

## 2017-07-04 DIAGNOSIS — Z955 Presence of coronary angioplasty implant and graft: Secondary | ICD-10-CM

## 2017-07-04 NOTE — Progress Notes (Signed)
Daily Session Note  Patient Details  Name: Antonio Barnes MRN: [id number] Date of Birth: [birth year] Referring Provider:     Cardiac Rehab from 05/05/2017 in [facility] Cardiac and Pulmonary Rehab  Referring Provider  Bartholome Bill MD      Encounter Date: 07/04/2017  Check In:     Session Check In - 07/04/17 0853      Check-In   Location [facility]-Cardiac & Pulmonary Rehab   Staff Present Nada Maclachlan, BA, ACSM CEP, Exercise Physiologist;Ebonie Westerlund Frederico Hamman, RN BSN;Meredith Sherryll Burger, RN BSN   Supervising physician immediately available to respond to emergencies See telemetry face sheet for immediately available ER MD   Medication changes reported     No   Fall or balance concerns reported    No   Tobacco Cessation No Change   Warm-up and Cool-down Performed on first and last piece of equipment   Resistance Training Performed Yes   VAD Patient? No     Pain Assessment   Currently in Pain? No/denies   Multiple Pain Sites No         History  Smoking Status  . Former Smoker  . Packs/day: 2.00  . Years: 30.00  Smokeless Tobacco  . Never Used    Comment: Quit 4 months ago  after restarting 10 years ago 1 pack a day    Goals Met:  Independence with exercise equipment Exercise tolerated well No report of cardiac concerns or symptoms Strength training completed today  Goals Unmet:  Not Applicable  Comments: Pt able to follow exercise prescription today without complaint.  Will continue to monitor for progression.    Dr. Emily Filbert is Medical Director for [place] and [facility].

## 2017-07-07 ENCOUNTER — Encounter: Payer: Medicare HMO | Admitting: *Deleted

## 2017-07-07 DIAGNOSIS — Z955 Presence of coronary angioplasty implant and graft: Secondary | ICD-10-CM | POA: Diagnosis not present

## 2017-07-07 DIAGNOSIS — Z48812 Encounter for surgical aftercare following surgery on the circulatory system: Secondary | ICD-10-CM | POA: Diagnosis not present

## 2017-07-07 NOTE — Progress Notes (Signed)
Daily Session Note  Patient Details  Name: Antonio Barnes MRN: [id number] Date of Birth: 02/04/1946 Referring Provider:     Cardiac Rehab from 05/05/2017 in [facility] Cardiac and Pulmonary Rehab  Referring Provider  Bartholome Bill MD      Encounter Date: 07/07/2017  Check In:     Session Check In - 07/07/17 0849      Check-In   Location [facility]-Cardiac & Pulmonary Rehab   Staff Present Gerlene Burdock, RN, Levie Heritage, MA, ACSM RCEP, Exercise Physiologist;Kelly Amedeo Plenty, BS, ACSM CEP, Exercise Physiologist   Supervising physician immediately available to respond to emergencies See telemetry face sheet for immediately available ER MD   Medication changes reported     No   Fall or balance concerns reported    No   Warm-up and Cool-down Performed on first and last piece of equipment   Resistance Training Performed Yes   VAD Patient? No     Pain Assessment   Currently in Pain? No/denies   Multiple Pain Sites No         History  Smoking Status  . Former Smoker  . Packs/day: 2.00  . Years: 30.00  Smokeless Tobacco  . Never Used    Comment: Quit 4 months ago  after restarting 10 years ago 1 pack a day    Goals Met:  Independence with exercise equipment Exercise tolerated well Personal goals reviewed No report of cardiac concerns or symptoms Strength training completed today  Goals Unmet:  Not Applicable  Comments: Pt able to follow exercise prescription today without complaint.  Will continue to monitor for progression. See ITP for goal review   Dr. Emily Filbert is Medical Director for [place] and [facility].

## 2017-07-11 DIAGNOSIS — Z48812 Encounter for surgical aftercare following surgery on the circulatory system: Secondary | ICD-10-CM | POA: Diagnosis not present

## 2017-07-11 DIAGNOSIS — Z955 Presence of coronary angioplasty implant and graft: Secondary | ICD-10-CM

## 2017-07-11 NOTE — Progress Notes (Signed)
Daily Session Note  Patient Details  Name: CHISTIAN KASLER MRN: [id number] Date of Birth: April 28, 1946 Referring Provider:     Cardiac Rehab from 05/05/2017 in [facility] Cardiac and Pulmonary Rehab  Referring Provider  Bartholome Bill MD      Encounter Date: 07/11/2017  Check In:     Session Check In - 07/11/17 0802      Check-In   Location [facility]-Cardiac & Pulmonary Rehab   Staff Present Alberteen Sam, MA, ACSM RCEP, Exercise Physiologist;Chaun Uemura Oletta Darter, BA, ACSM CEP, Exercise Physiologist;Meredith Sherryll Burger, RN BSN   Supervising physician immediately available to respond to emergencies See telemetry face sheet for immediately available ER MD   Medication changes reported     No   Fall or balance concerns reported    No   Warm-up and Cool-down Performed on first and last piece of equipment   Resistance Training Performed Yes   VAD Patient? No     Pain Assessment   Currently in Pain? No/denies         History  Smoking Status  . Former Smoker  . Packs/day: 2.00  . Years: 30.00  Smokeless Tobacco  . Never Used    Comment: Quit 4 months ago  after restarting 10 years ago 1 pack a day    Goals Met:  Independence with exercise equipment Exercise tolerated well No report of cardiac concerns or symptoms Strength training completed today  Goals Unmet:  Not Applicable  Comments: Pt able to follow exercise prescription today without complaint.  Will continue to monitor for progression.     Dr. Emily Filbert is Medical Director for [place] and [facility].

## 2017-07-18 ENCOUNTER — Encounter: Payer: Medicare HMO | Attending: Cardiology

## 2017-07-18 DIAGNOSIS — Z48812 Encounter for surgical aftercare following surgery on the circulatory system: Secondary | ICD-10-CM | POA: Insufficient documentation

## 2017-07-18 DIAGNOSIS — Z955 Presence of coronary angioplasty implant and graft: Secondary | ICD-10-CM | POA: Insufficient documentation

## 2017-07-23 ENCOUNTER — Encounter: Payer: Self-pay | Admitting: *Deleted

## 2017-07-23 DIAGNOSIS — Z955 Presence of coronary angioplasty implant and graft: Secondary | ICD-10-CM

## 2017-07-23 NOTE — Progress Notes (Signed)
Cardiac Individual Treatment Plan  Patient Details  Name: Antonio Barnes MRN: [id number] Date of Birth: 1945/11/18 Referring Provider:     Cardiac Rehab from 05/05/2017 in [facility]iac [facility] Re[facility]  Referring Provider  Bartholome Bill MD      Initial Encounter Date:    Cardiac Rehab from 05/05/2017 in [facility] Cardiac and Pulmonary Rehab  Date  05/05/17  Referring Provider  Bartholome Bill MD      Visit Diagnosis: Status post coronary artery stent placement  Patient's Home Medications on Admission:  Current Outpatient Medications:  .  Adalimumab (HUMIRA) 40 MG/0.8ML PSKT, Inject 40 mg into the skin. Every 2 weeks as needed for psoriasis, Disp: , Rfl:  .  albuterol (PROVENTIL HFA;VENTOLIN HFA) 108 (90 Base) MCG/ACT inhaler, Inhale 2 puffs into the lungs every 6 (six) hours as needed for wheezing or shortness of breath., Disp: , Rfl:  .  amLODipine-benazepril (LOTREL) 5-20 MG per capsule, Take 1 capsule by mouth daily., Disp: , Rfl:  .  aspirin EC 81 MG tablet, Take 81 mg by mouth daily., Disp: , Rfl:  .  betamethasone dipropionate (DIPROLENE) 0.05 % cream, Apply 1 application topically daily as needed (psoriasis). , Disp: , Rfl:  .  carvedilol (COREG) 25 MG tablet, Take 12.5 mg by mouth 2 (two) times daily with a meal. , Disp: , Rfl:  .  clopidogrel (PLAVIX) 75 MG tablet, Take 1 tablet (75 mg total) by mouth daily with breakfast., Disp: 30 tablet, Rfl: 11 .  ezetimibe (ZETIA) 10 MG tablet, Take 10 mg by mouth daily., Disp: , Rfl:  .  fexofenadine (WAL-FEX) 180 MG tablet, Take 180 mg by mouth daily as needed for allergies or rhinitis., Disp: , Rfl:  .  hydrochlorothiazide (HYDRODIURIL) 25 MG tablet, Take 25 mg by mouth daily., Disp: , Rfl:  .  levothyroxine (SYNTHROID, LEVOTHROID) 75 MCG tablet, Take 75 mcg by mouth daily before breakfast., Disp: , Rfl:  .  naproxen sodium (ANAPROX) 220 MG tablet, Take 440 mg by mouth daily as needed (pain). , Disp: , Rfl:  .  sildenafil (REVATIO) 20 MG  tablet, Take 40-100 mg by mouth daily as needed (erectile dysfunction). , Disp: , Rfl:   Past Medical History: Past Medical History:  Diagnosis Date  . Asthma   . Diabetes mellitus without complication ([place])   . Hypertension   . Myocardial infarct ([place])   . Prostate cancer ([place])     Tobacco Use: Social History   Tobacco Use  Smoking Status Former Smoker  . Packs/day: 2.00  . Years: 30.00  . Pack years: 60.00  Smokeless Tobacco Never Used  Tobacco Comment   Quit 4 months ago  after restarting 10 years ago 1 pack a day    Labs: Recent Review Flowsheet Data    There is no flowsheet data to display.       Exercise Target Goals:    Exercise Program Goal: Individual exercise prescription set with THRR, safety & activity barriers. Participant demonstrates ability to understand and report RPE using BORG scale, to self-measure pulse accurately, and to acknowledge the importance of the exercise prescription.  Exercise Prescription Goal: Starting with aerobic activity 30 plus minutes a day, 3 days per week for initial exercise prescription. Provide home exercise prescription and guidelines that participant acknowledges understanding prior to discharge.  Activity Barriers & Risk Stratification: Activity Barriers & Cardiac Risk Stratification - 05/05/17 1343      Activity Barriers & Cardiac Risk Stratification   Activity  Barriers  Joint Problems;Deconditioning;Muscular Weakness;Shortness of Breath Right hip  - standing or moving can cause pain in hip and down his leg. Has had cortisone shots that help alleviate the pain, possible  [employer]   Right hip  - standing or moving can cause pain in hip and down his leg. Has had cortisone shots that help alleviate the pain, possible  sciattica   Cardiac Risk Stratification  High       6 Minute Walk: 6 Minute Walk    Row Name 05/05/17 1411 07/04/17 1141       6 Minute Walk   Phase  Initial  Discharge    Distance  1560 feet  1658  feet    Distance % Change  -  6 %    Distance Feet Change  -  98 ft    Walk Time  6 minutes  6 minutes    # of Rest Breaks  0  0    MPH  2.95  3.14    METS  3.57  3.99    RPE  12  11    Perceived Dyspnea   2  -    VO2 Peak  12.5  13.98    Symptoms  Yes (comment)  No    Comments  SOB  -    Resting HR  53 bpm  68 bpm    Resting BP  103/74  132/72    Resting Oxygen Saturation   -  100 %    Exercise Oxygen Saturation  during 6 min walk  -  99 %    Max Ex. HR  108 bpm  110 bpm    Max Ex. BP  132/64  156/80    2 Minute Post BP  122/60  -       Oxygen Initial Assessment:   Oxygen Re-Evaluation:   Oxygen Discharge (Final Oxygen Re-Evaluation):   Initial Exercise Prescription: Initial Exercise Prescription - 05/05/17 1400      Date of Initial Exercise RX and Referring Provider   Date  05/05/17    Referring Provider  Bartholome Bill MD      Treadmill   MPH  2.9    Grade  0.5    Minutes  15    METs  3.42      Recumbant Bike   Level  3    RPM  35    Watts  50    Minutes  15    METs  3.5      T5 Nustep   Level  3    SPM  80    Minutes  15    METs  3      Prescription Details   Frequency (times per week)  3    Duration  Progress to 45 minutes of aerobic exercise without signs/symptoms of physical distress      Intensity   THRR 40-80% of Max Heartrate  93-132    Ratings of Perceived Exertion  11-13    Perceived Dyspnea  0-4      Progression   Progression  Continue to progress workloads to maintain intensity without signs/symptoms of physical distress.      Resistance Training   Training Prescription  Yes    Weight  4 lbs    Reps  10-15       Perform Capillary Blood Glucose checks as needed.  Exercise Prescription Changes: Exercise Prescription Changes    Row Name 05/05/17 1300 05/16/17 0800 05/21/17 1500  06/04/17 1500 06/19/17 1000     Response to Exercise   Blood Pressure (Admit)  130/74  -  118/74  124/64  114/60   Blood Pressure (Exercise)  132/64   -  130/72  124/74  142/84   Blood Pressure (Exit)  122/60  -  124/76  126/60  112/60   Heart Rate (Admit)  53 bpm  -  64 bpm  67 bpm  82 bpm   Heart Rate (Exercise)  108 bpm  -  95 bpm  115 bpm  131 bpm   Heart Rate (Exit)  68 bpm  -  62 bpm  72 bpm  75 bpm   Oxygen Saturation (Admit)  99 %  -  -  -  -   Oxygen Saturation (Exercise)  99 %  -  -  -  -   Rating of Perceived Exertion (Exercise)  12  -  _0 Perceived Dyspnea (Exercise)  2  -  -  -  -   Symptoms  SOB  -  none  none  none   Comments  walk test results  -  -  -  -   Duration  -  -  Continue with 45 min of aerobic exercise without signs/symptoms of physical distress.  Continue with 45 min of aerobic exercise without signs/symptoms of physical distress.  Continue with 45 min of aerobic exercise without signs/symptoms of physical distress.   Intensity  -  -  THRR unchanged  THRR unchanged  THRR unchanged     Progression   Progression  -  -  Continue to progress workloads to maintain intensity without signs/symptoms of physical distress.  Continue to progress workloads to maintain intensity without signs/symptoms of physical distress.  Continue to progress workloads to maintain intensity without signs/symptoms of physical distress.   Average METs  -  -  3.46  3.3  3.36     Resistance Training   Training Prescription  -  -  Yes  Yes  Yes   Weight  -  -  4 lbs  5 lbs  7 lbs   Reps  -  -  10-15  10-15  10-15     Interval Training   Interval Training  -  -  No  No  No     Treadmill   MPH  -  -  _1 Grade  -  -  _2 Minutes  -  -  _3 METs  -  -  4.12  4.12  4.12     Recumbant Bike   Level  -  -  _4 Watts  -  -  64  50  50   Minutes  -  -  _5 METs  -  -  4.05  3.57  3.57     T5 Nustep   Level  -  -  _6 Minutes  -  -  _7 METs  -  -  2.2  2.2  2.4     Home Exercise Plan   Plans to continue exercise at  -  Home (comment) walking, Bowflex, and handweights  Home  (comment) walking, Bowflex, and handweights  Home (comment) walking, Bowflex, and handweights  Home (comment) walking, Bowflex, and handweights   Frequency  -  Add 1 additional day to program exercise sessions.  Add 1 additional day to program exercise sessions.  Add 1 additional day to program exercise sessions.  Add 1 additional day to program exercise sessions.   Initial Home Exercises Provided  -  05/16/17  05/16/17  05/16/17  05/16/17   Row Name 07/02/17 1500 07/16/17 1500           Response to Exercise   Blood Pressure (Admit)  152/86  122/70      Blood Pressure (Exercise)  162/82  126/70      Blood Pressure (Exit)  128/80  128/78      Heart Rate (Admit)  70 bpm  85 bpm      Heart Rate (Exercise)  109 bpm  101 bpm      Heart Rate (Exit)  64 bpm  69 bpm      Rating of Perceived Exertion (Exercise)  14  12      Symptoms  none  none      Duration  Continue with 45 min of aerobic exercise without signs/symptoms of physical distress.  Continue with 45 min of aerobic exercise without signs/symptoms of physical distress.      Intensity  THRR unchanged  THRR unchanged        Progression   Progression  Continue to progress workloads to maintain intensity without signs/symptoms of physical distress.  Continue to progress workloads to maintain intensity without signs/symptoms of physical distress.      Average METs  3.85  3.41        Resistance Training   Training Prescription  Yes  Yes      Weight  7 lbs  7 lbs      Reps  10-15  10-15        Interval Training   Interval Training  No  No        Treadmill   MPH  3  3      Grade  2  2      Minutes  15  15      METs  4.12  4.12        Recumbant Bike   Level  9  9      Watts  51  43      Minutes  15  15      METs  3.57  3.42        T5 Nustep   Level  -  7      Minutes  -  15      METs  -  2.7        Home Exercise Plan   Plans to continue exercise at  Home (comment) walking, Bowflex, and handweights  Home (comment) walking,  Bowflex, and handweights      Frequency  Add 1 additional day to program exercise sessions.  Add 2 additional days to program exercise sessions.      Initial Home Exercises Provided  05/16/17  05/16/17         Exercise Comments: Exercise Comments    Row Name 05/09/17 0854 05/23/17 0813         Exercise Comments   First full day of exercise!  Patient was oriented to gym and equipment including functions, settings, policies, and procedures.  Patient's individual exercise prescription and treatment plan were reviewed.  All starting workloads were established based on the results of  the 6 minute walk test done at initial orientation visit.  The plan for exercise progression was also introduced and progression will be customized based on patient's performance and goals.  Started interval training today!         Exercise Goals and Review: Exercise Goals    Row Name 05/05/17 1415             Exercise Goals   Increase Physical Activity  Yes       Intervention  Provide advice, education, support and counseling about physical activity/exercise needs.;Develop an individualized exercise prescription for aerobic and resistive training based on initial evaluation findings, risk stratification, comorbidities and participant's personal goals.       Expected Outcomes  Achievement of increased cardiorespiratory fitness and enhanced flexibility, muscular endurance and strength shown through measurements of functional capacity and personal statement of participant.       Increase Strength and Stamina  Yes       Intervention  Provide advice, education, support and counseling about physical activity/exercise needs.;Develop an individualized exercise prescription for aerobic and resistive training based on initial evaluation findings, risk stratification, comorbidities and participant's personal goals.       Expected Outcomes  Achievement of increased cardiorespiratory fitness and enhanced flexibility,  muscular endurance and strength shown through measurements of functional capacity and personal statement of participant.          Exercise Goals Re-Evaluation : Exercise Goals Re-Evaluation    Row Name 05/16/17 1655 05/21/17 1525 06/04/17 1509 06/19/17 1032 07/02/17 1523     Exercise Goal Re-Evaluation   Exercise Goals Review  Increase Physical Activity;Increase Strength and Stamina;Able to understand and use rate of perceived exertion (RPE) scale;Knowledge and understanding of Target Heart Rate Range (THRR);Able to check pulse independently;Understanding of Exercise Prescription  Increase Strength and Stamina;Increase Physical Activity  Increase Strength and Stamina;Increase Physical Activity  Increase Strength and Stamina;Increase Physical Activity  Increase Physical Activity;Increase Strength and Stamina   Comments  Reviewed home exercise with pt today.  Pt plans to walk, bowflex and handweights for exercise.  Reviewed THR, pulse, RPE, sign and symptoms, NTG use, and when to call 911 or MD.  Also discussed weather considerations and indoor options.  Pt voiced understanding.  Antonio Barnes is off to a good start in rehab.   He is already up to 64 watts on the recumbent bike.  We will talk about possibly adding in intervals to prescription.  We will continue to monitor his progression.  Antonio Barnes continues to do well in rehab.  He is now up to level 9 on the recumbent bike.  We will continue to monitor his progression.   Antonio Barnes has been doing well in rehab.  He is up to level 7 on the T5 NuStep.  He prefers the higher workloads versus intervals.  We will continue to monitor his progress.   Antonio Barnes continues to use higher resistance on the bike and NS.     Expected Outcomes  Short - Add one day of exercise outside class times Long - maintain independent exercise    Short: Add in intervals.  Long: Make exercise part of routine.   Short: Talk about intervals again.  Long: Continue to exercise regularly.  Short: Continue to work hard  in rehab.  Long: Continue to exercise regularly.   Short - Antonio Barnes will maintain fitness gains.  Long - Antonio Barnes will exercise 3-5 days between class and home exercise.   [place] Name 07/07/17 [phone]  Exercise Goal Re-Evaluation   Exercise Goals Review  Increase Physical Activity;Increase Strength and Stamina;Understanding of Exercise Prescription       Comments  Antonio Barnes graduates on Friday.  He is planning to join the [place] with his [place] and is waiting on a call from [facility] to set up his orientation appointment.  He is feeling stronger and has more stamina.  He likes that exercise makes him get up and out of bed each day.  He is also breathing better overall!       Expected Outcomes  Short: Graduate!  Long: Continue to exercise independently to maintain what he has gained during rehab.          Discharge Exercise Prescription (Final Exercise Prescription Changes): Exercise Prescription Changes - 07/16/17 1500      Response to Exercise   Blood Pressure (Admit)  122/70    Blood Pressure (Exercise)  126/70    Blood Pressure (Exit)  128/78    Heart Rate (Admit)  85 bpm    Heart Rate (Exercise)  101 bpm    Heart Rate (Exit)  69 bpm    Rating of Perceived Exertion (Exercise)  12    Symptoms  none    Duration  Continue with 45 min of aerobic exercise without signs/symptoms of physical distress.    Intensity  THRR unchanged      Progression   Progression  Continue to progress workloads to maintain intensity without signs/symptoms of physical distress.    Average METs  3.41      Resistance Training   Training Prescription  Yes    Weight  7 lbs    Reps  10-15      Interval Training   Interval Training  No      Treadmill   MPH  3    Grade  2    Minutes  15    METs  4.12      Recumbant Bike   Level  9    Watts  43    Minutes  15    METs  3.42      T5 Nustep   Level  7    Minutes  15    METs  2.7      Home Exercise Plan   Plans to continue exercise at  Home  (comment) walking, Bowflex, and handweights   walking, Bowflex, and handweights   Frequency  Add 2 additional days to program exercise sessions.    Initial Home Exercises Provided  05/16/17       Nutrition:  Target Goals: Understanding of nutrition guidelines, daily intake of sodium <155m, cholesterol <20100m calories 30% from fat and 7% or less from saturated fats, daily to have 5 or more servings of fruits and vegetables.  Biometrics: Pre Biometrics - 05/05/17 1416      Pre Biometrics   Height  _0  (1.88 m)    Weight  222 lb 12.8 oz (101.1 kg)    Waist Circumference  44.5 inches    Hip Circumference  39.5 inches    Waist to Hip Ratio  1.13 %    BMI (Calculated)  28.7    Single Leg Stand  20.07 seconds      Post Biometrics - 07/04/17 1140       Post  Biometrics   Height  _1  (1.88 m)    Weight  219 lb (99.3 kg)    Waist Circumference  44 inches    Hip Circumference  42.5 inches    Waist to Hip Ratio  1.04 %    BMI (Calculated)  28.11    Single Leg Stand  11.35 seconds       Nutrition Therapy Plan and Nutrition Goals: Nutrition Therapy & Goals - 05/26/17 1428      Nutrition Therapy   Diet  Instructed on a meal plan based on heart healthy dietary guidelines and including diabetes dietary guidelines.    Protein (specify units)  8    Fiber  30 grams    Whole Grain Foods  3 servings    Saturated Fats  14 max. grams    Fruits and Vegetables  5 servings/day    Sodium  2000 grams 1500 mg-ideal   1500 mg-ideal     Personal Nutrition Goals   Nutrition Goal  Pour water off canned vegetables and replace with fresh water to reduce sodium.    Personal Goal #2  Decrease added salt. Try Morton lite salt and limit its use.    Personal Goal #3  If choose one meal that is high in fat, try to make lower fat choices at other meals that day.    Personal Goal #4  Switch to unsweetened tea added with Splenda or Sweet 'n low.      Intervention Plan   Intervention  Prescribe,  educate and counsel regarding individualized specific dietary modifications aiming towards targeted core components such as weight, hypertension, lipid management, diabetes, heart failure and other comorbidities.;Nutrition handout(s) given to patient.    Expected Outcomes  Short Term Goal: Understand basic principles of dietary content, such as calories, fat, sodium, cholesterol and nutrients.;Short Term Goal: A plan has been developed with personal nutrition goals set during dietitian appointment.;Long Term Goal: Adherence to prescribed nutrition plan.       Nutrition Discharge: Rate Your Plate Scores: Nutrition Assessments - 07/11/17 0902      MEDFICTS Scores   Pre Score  140    Post Score  -- declined post assessment   declined post assessment      Nutrition Goals Re-Evaluation: Nutrition Goals Re-Evaluation    [place] Name 06/13/17 0930 06/25/17 0619 07/07/17 0858         Goals   Nutrition Goal  -  -  Pour water off canned vegetables and replace with fresh water to reduce sodium.     Comment  Antonio Barnes met with the RD but has not started any of her suggestions.  He eats out a lot and admits to eating late at night.    -  Antonio Barnes has continued to only make minor changes to his diet.  He enjoys eating out, but is trying to be more  mindful of his food choices.  He is also trying to cut back on his late night snacking.      Expected Outcome  Short - Antonio Barnes will drink more water and leave off eating wings in the next week.  Long - Antonio Barnes will make small changes to his diet to work towards a healthier diet overall.  Heart healthier eating  Short: Continue to make changes.  Long: Continue to follow a heart healthy diet.        Personal Goal #2 Re-Evaluation   Personal Goal #2  -  Antonio Barnes is trying to decrease his salt intake  -       Personal Goal #3 Re-Evaluation   Personal Goal #3  -  Antonio "Antonio Barnes" is trying to eat somewhat healthier  -  Personal Goal #4 Re-Evaluation   Personal Goal #4  -  is trying to switch    -        Nutrition Goals Discharge (Final Nutrition Goals Re-Evaluation): Nutrition Goals Re-Evaluation - 07/07/17 0858      Goals   Nutrition Goal  Pour water off canned vegetables and replace with fresh water to reduce sodium.    Comment  Antonio Barnes has continued to only make minor changes to his diet.  He enjoys eating out, but is trying to be more  mindful of his food choices.  He is also trying to cut back on his late night snacking.     Expected Outcome  Short: Continue to make changes.  Long: Continue to follow a heart healthy diet.        Psychosocial: Target Goals: Acknowledge presence or absence of significant depression and/or stress, maximize coping skills, provide positive support system. Participant is able to verbalize types and ability to use techniques and skills needed for reducing stress and depression.   Initial Review & Psychosocial Screening: Initial Psych Review & Screening - 05/05/17 1413      Initial Review   Current issues with  None Identified      Family Dynamics   Good Support System?  Yes children      children        Barriers   Psychosocial barriers to participate in program  There are no identifiable barriers or psychosocial needs.;The patient should benefit from training in stress management and relaxation.      Screening Interventions   Interventions  Encouraged to exercise;Provide feedback about the scores to participant;To provide support and resources with identified psychosocial needs       Quality of Life Scores:  Quality of Life - 07/11/17 0903      Quality of Life Scores   Health/Function Pre  23.23 %    Health/Function Post  25.85 %    Health/Function % Change  11.28 %    Socioeconomic Pre  26.79 %    Socioeconomic Post  22.75 %    Socioeconomic % Change   -15.08 %    Psych/Spiritual Pre  23.64 %    Psych/Spiritual Post  27.5 %    Psych/Spiritual % Change  16.33 %    Family Pre  26.9 %    Family Post  28.8 %    Family % Change   7.06 %    GLOBAL Pre  24.59 %    GLOBAL Post  25.91 %    GLOBAL % Change  5.37 %       PHQ-9: Recent Review Flowsheet Data    Depression screen [facility] 2/9 07/11/2017 05/05/2017   Decreased Interest 0 1   Down, Depressed, Hopeless 0 0   PHQ - 2 Score 0 1   Altered sleeping 1 1   Tired, decreased energy 1 1   Change in appetite 1 2   Feeling bad or failure about yourself  1 0   Trouble concentrating 0 0   Moving slowly or fidgety/restless 0 0   Suicidal thoughts 0 0   PHQ-9 Score 4 5   Difficult doing work/chores Not difficult at all Not difficult at all     Interpretation of Total Score  Total Score Depression Severity:  1-4 = Minimal depression, 5-9 = Mild depression, 10-14 = Moderate depression, 15-19 = Moderately severe depression, 20-27 = Severe depression   Psychosocial Evaluation and Intervention: Psychosocial Evaluation - 05/21/17 1062  Psychosocial Evaluation & Interventions   Interventions  Encouraged to exercise with the program and follow exercise prescription    Comments  Counselor met with Mr. Goyer (Antonio Barnes) today for initial psychosocial evaluation.  He is a 71 year old who had a stent inserted several weeks ago and was encouraged to come to this program by his Dr.  He has a strong support system with a son and daughter who live close by.  Antonio Barnes has minimal health issues and reports sleeping well and eating "too good."  He denies a history of depression or anxiety or any current symptoms and has minimal stress in his life at this time.  He has goals to lose weight and get in the habit of exercising more consistently.  Counselor discussed some options for Antonio Barnes to consider once he completes this program to continue this positive habit.  Staff will follow with Antonio Barnes throughout the course of this program.     Expected Outcomes  Antonio Barnes will benefit from consistent exercise to achieve his stated goals.  The educational and psychoeducational components of this program will be helpful in  understanding and coping with his illness and improving his eating habits.  Antonio Barnes will see the dietician to address his weight loss goals.      Continue Psychosocial Services   Follow up required by staff       Psychosocial Re-Evaluation: Psychosocial Re-Evaluation    [place] Name 06/13/17 0932             Psychosocial Re-Evaluation   Comments  Antonio Barnes doesn't report any new concerns.       Expected Outcomes  Short - Antonio Barnes will continue to exercise.  Long - Antonio Barnes will complete HT and use the stress management skills to maintain his low level of stress.            Psychosocial Discharge (Final Psychosocial Re-Evaluation): Psychosocial Re-Evaluation - 06/13/17 0932      Psychosocial Re-Evaluation   Comments  Antonio Barnes doesn't report any new concerns.    Expected Outcomes  Short - Antonio Barnes will continue to exercise.  Long - Antonio Barnes will complete HT and use the stress management skills to maintain his low level of stress.         Vocational Rehabilitation: Provide vocational rehab assistance to qualifying candidates.   Vocational Rehab Evaluation & Intervention: Vocational Rehab - 05/05/17 1420      Initial Vocational Rehab Evaluation & Intervention   Assessment shows need for Vocational Rehabilitation  No       Education: Education Goals: Education classes will be provided on a variety of topics geared toward better understanding of heart health and risk factor modification. Participant will state understanding/return demonstration of topics presented as noted by education test scores.  Learning Barriers/Preferences: Learning Barriers/Preferences - 05/05/17 1417      Learning Barriers/Preferences   Learning Barriers  Hearing Does not have hearing aids   Does not have hearing aids   Learning Preferences  None       Education Topics: General Nutrition Guidelines/Fats and Fiber: -Group instruction provided by verbal, written material, models and posters to present the general guidelines for heart healthy  nutrition. Gives an explanation and review of dietary fats and fiber.   Cardiac Rehab from 07/07/2017 in [facility] Cardiac and Pulmonary Rehab  Date  06/30/17  Educator  CR  Instruction Review Code  1- Verbalizes Understanding      Controlling Sodium/Reading Food Labels: -Group verbal and written material supporting the discussion of  sodium use in heart healthy nutrition. Review and explanation with models, verbal and written materials for utilization of the food label.   Cardiac Rehab from 07/07/2017 in [facility] Cardiac and Pulmonary Rehab  Date  05/12/17  Educator  CR  Instruction Review Code  1- Verbalizes Understanding      Exercise Physiology & Risk Factors: - Group verbal and written instruction with models to review the exercise physiology of the cardiovascular system and associated critical values. Details cardiovascular disease risk factors and the goals associated with each risk factor.   Cardiac Rehab from 07/07/2017 in [facility] Cardiac and Pulmonary Rehab  Date  05/21/17  Educator  [facility]  Instruction Review Code  1- Verbalizes Understanding      Aerobic Exercise & Resistance Training: - Gives group verbal and written discussion on the health impact of inactivity. On the components of aerobic and resistive training programs and the benefits of this training and how to safely progress through these programs.   Cardiac Rehab from 07/07/2017 in [facility] Cardiac and Pulmonary Rehab  Date  05/26/17  Educator  [facility]  Instruction Review Code  1- Verbalizes Understanding      Flexibility, Balance, General Exercise Guidelines: - Provides group verbal and written instruction on the benefits of flexibility and balance training programs. Provides general exercise guidelines with specific guidelines to those with heart or lung disease. Demonstration and skill practice provided.   Cardiac Rehab from 07/07/2017 in [facility] Cardiac and Pulmonary Rehab  Date  05/28/17  Educator  [facility]  Instruction Review Code   1- Verbalizes Understanding      Stress Management: - Provides group verbal and written instruction about the health risks of elevated stress, cause of high stress, and healthy ways to reduce stress.   Depression: - Provides group verbal and written instruction on the correlation between heart/lung disease and depressed mood, treatment options, and the stigmas associated with seeking treatment.   Cardiac Rehab from 07/07/2017 in [facility] Cardiac and Pulmonary Rehab  Date  07/02/17  Educator  [facility]  Instruction Review Code  1- Verbalizes Understanding      Anatomy & Physiology of the Heart: - Group verbal and written instruction and models provide basic cardiac anatomy and physiology, with the coronary electrical and arterial systems. Review of: AMI, Angina, Valve disease, Heart Failure, Cardiac Arrhythmia, Pacemakers, and the ICD.   Cardiac Procedures: - Group verbal and written instruction to review commonly prescribed medications for heart disease. Reviews the medication, class of the drug, and side effects. Includes the steps to properly store meds and maintain the prescription regimen. (beta blockers and nitrates)   Cardiac Rehab from 07/07/2017 in [facility] Cardiac and Pulmonary Rehab  Date  06/09/17  Educator  CE  Instruction Review Code  1- Verbalizes Understanding      Cardiac Medications I: - Group verbal and written instruction to review commonly prescribed medications for heart disease. Reviews the medication, class of the drug, and side effects. Includes the steps to properly store meds and maintain the prescription regimen.   Cardiac Rehab from 07/07/2017 in [facility] Cardiac and Pulmonary Rehab  Date  06/16/17  Educator  [facility]  Instruction Review Code  1- Verbalizes Understanding      Cardiac Medications II: -Group verbal and written instruction to review commonly prescribed medications for heart disease. Reviews the medication, class of the drug, and side effects. (all other drug  classes)    Go Sex-Intimacy & Heart Disease, Get SMART - Goal Setting: - Group verbal and written  instruction through game format to discuss heart disease and the return to sexual intimacy. Provides group verbal and written material to discuss and apply goal setting through the application of the S.M.A.R.T. Method.   Cardiac Rehab from 07/07/2017 in [facility] Cardiac and Pulmonary Rehab  Date  06/09/17  Educator  CE  Instruction Review Code  1- Verbalizes Understanding      Other Matters of the Heart: - Provides group verbal, written materials and models to describe Heart Failure, Angina, Valve Disease, Peripheral Artery Disease, and Diabetes in the realm of heart disease. Includes description of the disease process and treatment options available to the cardiac patient.   Exercise & Equipment Safety: - Individual verbal instruction and demonstration of equipment use and safety with use of the equipment.   Cardiac Rehab from 07/07/2017 in [facility] Cardiac and Pulmonary Rehab  Date  05/05/17  Educator  SB  Instruction Review Code  1- Verbalizes Understanding      Infection Prevention: - Provides verbal and written material to individual with discussion of infection control including proper hand washing and proper equipment cleaning during exercise session.   Cardiac Rehab from 07/07/2017 in [facility] Cardiac and Pulmonary Rehab  Date  05/05/17  Educator  SB  Instruction Review Code  1- Verbalizes Understanding      Falls Prevention: - Provides verbal and written material to individual with discussion of falls prevention and safety.   Cardiac Rehab from 07/07/2017 in [facility] Cardiac and Pulmonary Rehab  Date  05/05/17  Educator  SB  Instruction Review Code (retired)  2- meets goals/outcomes  Instruction Review Code  1- Science writer Understanding      Diabetes: - Individual verbal and written instruction to review signs/symptoms of diabetes, desired ranges of glucose level fasting, after  meals and with exercise. Acknowledge that pre and post exercise glucose checks will be done for 3 sessions at entry of program.   Other: -Provides group and verbal instruction on various topics (see comments)    Knowledge Questionnaire Score: Knowledge Questionnaire Score - 07/11/17 0902      Knowledge Questionnaire Score   Pre Score  16/28    Post Score  25/28       Core Components/Risk Factors/Patient Goals at Admission: Personal Goals and Risk Factors at Admission - 05/05/17 1410      Core Components/Risk Factors/Patient Goals on Admission    Weight Management  Yes;Obesity    Intervention  Weight Management: Develop a combined nutrition and exercise program designed to reach desired caloric intake, while maintaining appropriate intake of nutrient and fiber, sodium and fats, and appropriate energy expenditure required for the weight goal.;Weight Management: Provide education and appropriate resources to help participant work on and attain dietary goals.;Weight Management/Obesity: Establish reasonable short term and long term weight goals.;Obesity: Provide education and appropriate resources to help participant work on and attain dietary goals.    Admit Weight  222 lb 12.8 oz (101.1 kg)    Goal Weight: Short Term  220 lb (99.8 kg)    Goal Weight: Long Term  200 lb (90.7 kg)    Expected Outcomes  Short Term: Continue to assess and modify interventions until short term weight is achieved;Long Term: Adherence to nutrition and physical activity/exercise program aimed toward attainment of established weight goal;Weight Loss: Understanding of general recommendations for a balanced deficit meal plan, which promotes 1-2 lb weight loss per week and includes a negative energy balance of [phone] kcal/d    Tobacco Cessation  Yes Quit 4 months  ago   Quit 4 months ago   Number of packs per day  1    Intervention  Assist the participant in steps to quit. Provide individualized education and  counseling about committing to Tobacco Cessation, relapse prevention, and pharmacological support that can be provided by physician.;Advice worker, assist with locating and accessing local/national Quit Smoking programs, and support quit date choice.    Expected Outcomes  Short Term: Will demonstrate readiness to quit, by selecting a quit date.;Long Term: Complete abstinence from all tobacco products for at least 12 months from quit date.;Short Term: Will quit all tobacco product use, adhering to prevention of relapse plan.    Hypertension  Yes    Intervention  Provide education on lifestyle modifcations including regular physical activity/exercise, weight management, moderate sodium restriction and increased consumption of fresh fruit, vegetables, and low fat dairy, alcohol moderation, and smoking cessation.;Monitor prescription use compliance.    Expected Outcomes  Short Term: Continued assessment and intervention until BP is < 140/32m HG in hypertensive participants. < 130/869mHG in hypertensive participants with diabetes, heart failure or chronic kidney disease.;Long Term: Maintenance of blood pressure at goal levels.    Lipids  Yes    Intervention  Provide education and support for participant on nutrition & aerobic/resistive exercise along with prescribed medications to achieve LDL <704mHDL >21m36m  Expected Outcomes  Short Term: Participant states understanding of desired cholesterol values and is compliant with medications prescribed. Participant is following exercise prescription and nutrition guidelines.;Long Term: Cholesterol controlled with medications as prescribed, with individualized exercise RX and with personalized nutrition plan. Value goals: LDL < 70mg82mL > 40 mg.       Core Components/Risk Factors/Patient Goals Review:  Goals and Risk Factor Review    Row Name 06/13/17 0927 [id number]           Core Components/Risk Factors/Patient Goals Review    Personal Goals Review  Weight Management/Obesity;Hypertension;Lipids  Weight Management/Obesity;Hypertension;Lipids      Review  Antonio Barnes is maintaining his current weight.  He has met with the dietician.  He is taking all medications as directed.  His BP is still a little higher than ideal.  Antonio Barnes is ready to graduate on Friday. He has maintained his weight and feels that he is breathing better overall.  He continues to have slightly elevated blood pressures but they are regular and don't fluctuate much.        Expected Outcomes  Short - Antonio Barnes will coontinue to exercise and take meds as directed.  Long - Antonio Barnes will work with his Dr to get mubers at optimal levels.  Short: Graduate!  Long: Continue to work on risk factor modifications.         Core Components/Risk Factors/Patient Goals at Discharge (Final Review):  Goals and Risk Factor Review - 07/07/17 0855      Core Components/Risk Factors/Patient Goals Review   Personal Goals Review  Weight Management/Obesity;Hypertension;Lipids    Review  Antonio Barnes is ready to graduate on Friday. He has maintained his weight and feels that he is breathing better overall.  He continues to have slightly elevated blood pressures but they are regular and don't fluctuate much.      Expected Outcomes  Short: Graduate!  Long: Continue to work on risk factor modifications.       ITP Comments: ITP Comments    Row Name 05/05/17 1334 05/28/17 1053 06/25/17 0621 06/27/17 0811 07/23/17 0621   ITP Comments  Medical review completed.  Initial ITP created and ready for review and changes if indicates by [facility] Director.  Documentation of diagnosis can be found in [facility] 04/24/2017 admission  30 day review. Continue with ITP unless directed changes per Medical Director review.    30 day review. Continue with ITP unless directed changes per Medical Director review.    Antonio Barnes left a early as he felt like he was coming down with a cold.  30 day review. Continue with ITP unless directed changes per Medical  Director review.       Comments:

## 2017-07-28 ENCOUNTER — Telehealth: Payer: Self-pay | Admitting: *Deleted

## 2017-07-28 ENCOUNTER — Encounter: Payer: Self-pay | Admitting: *Deleted

## 2017-07-28 DIAGNOSIS — Z955 Presence of coronary angioplasty implant and graft: Secondary | ICD-10-CM

## 2017-07-28 NOTE — Telephone Encounter (Signed)
Called to check up on status of return.  He has been out with the flu, but thought he had finished up at the end of October.  He still had one visit left, but would like to just be discharged at this time as he is already going to the other gym.

## 2017-07-28 NOTE — Progress Notes (Signed)
Discharge Progress Report  Patient Details  Name: Antonio Barnes MRN: [id number] Date of Birth: Mar 10, 1946 Referring Provider:     Cardiac Rehab from 05/05/2017 in [facility]  Referring Provider  Bartholome Bill MD       Number of Visits: 34/36  Reason for Discharge:  Patient reached a stable level of exercise. Patient independent in their exercise. Patient has met program and personal goals.  Smoking History:  Social History   Tobacco Use  Smoking Status Former Smoker  . Packs/day: 2.00  . Years: 30.00  . Pack years: 60.00  Smokeless Tobacco Never Used  Tobacco Comment   Quit 4 months ago  after restarting 10 years ago 1 pack a day    Diagnosis:  Status post coronary artery stent placement  ADL [facility]SD:   Initial Exercise Prescription: Initial Exercise Prescription - 05/05/17 1400      Date of Initial Exercise RX and Referring Provider   Date  05/05/17    Referring Provider  Bartholome Bill MD      Treadmill   MPH  2.9    Grade  0.5    Minutes  15    METs  3.42      Recumbant Bike   Level  3    RPM  35    Watts  50    Minutes  15    METs  3.5      T5 Nustep   Level  3    SPM  80    Minutes  15    METs  3      Prescription Details   Frequency (times per week)  3    Duration  Progress to 45 minutes of aerobic exercise without signs/symptoms of physical distress      Intensity   THRR 40-80% of Max Heartrate  93-132    Ratings of Perceived Exertion  11-13    Perceived Dyspnea  0-4      Progression   Progression  Continue to progress workloads to maintain intensity without signs/symptoms of physical distress.      Resistance Training   Training Prescription  Yes    Weight  4 lbs    Reps  10-15       Discharge Exercise Prescription (Final Exercise Prescription Changes): Exercise Prescription Changes - 07/16/17 1500      Response to Exercise   Blood Pressure (Admit)  122/70    Blood Pressure (Exercise)  126/70    Blood  Pressure (Exit)  128/78    Heart Rate (Admit)  85 bpm    Heart Rate (Exercise)  101 bpm    Heart Rate (Exit)  69 bpm    Rating of Perceived Exertion (Exercise)  12    Symptoms  none    Duration  Continue with 45 min of aerobic exercise without signs/symptoms of physical distress.    Intensity  THRR unchanged      Progression   Progression  Continue to progress workloads to maintain intensity without signs/symptoms of physical distress.    Average METs  3.41      Resistance Training   Training Prescription  Yes    Weight  7 lbs    Reps  10-15      Interval Training   Interval Training  No      Treadmill   MPH  3    Grade  2    Minutes  15    METs  4.12  Recumbant Bike   Level  9    Watts  43    Minutes  15    METs  3.42      T5 Nustep   Level  7    Minutes  15    METs  2.7      Home Exercise Plan   Plans to continue exercise at  Home (comment) walking, Bowflex, and handweights   walking, Bowflex, and handweights   Frequency  Add 2 additional days to program exercise sessions.    Initial Home Exercises Provided  05/16/17       Functional Capacity: 6 Minute Walk    Row Name 05/05/17 1411 07/04/17 1141       6 Minute Walk   Phase  Initial  Discharge    Distance  1560 feet  1658 feet    Distance % Change  -  6 %    Distance Feet Change  -  98 ft    Walk Time  6 minutes  6 minutes    # of Rest Breaks  0  0    MPH  2.95  3.14    METS  3.57  3.99    RPE  12  11    Perceived Dyspnea   2  -    VO2 Peak  12.5  13.98    Symptoms  Yes (comment)  No    Comments  SOB  -    Resting HR  53 bpm  68 bpm    Resting BP  103/74  132/72    Resting Oxygen Saturation   -  100 %    Exercise Oxygen Saturation  during 6 min walk  -  99 %    Max Ex. HR  108 bpm  110 bpm    Max Ex. BP  132/64  156/80    2 Minute Post BP  122/60  -       Psychological, QOL, Others - Outcomes: PHQ 2/9: Depression screen [facility] 2/9 07/11/2017 05/05/2017  Decreased Interest 0 1  Down,  Depressed, Hopeless 0 0  PHQ - 2 Score 0 1  Altered sleeping 1 1  Tired, decreased energy 1 1  Change in appetite 1 2  Feeling bad or failure about yourself  1 0  Trouble concentrating 0 0  Moving slowly or fidgety/restless 0 0  Suicidal thoughts 0 0  PHQ-9 Score 4 5  Difficult doing work/chores Not difficult at all Not difficult at all    Quality of Life: Quality of Life - 07/11/17 0903      Quality of Life Scores   Health/Function Pre  23.23 %    Health/Function Post  25.85 %    Health/Function % Change  11.28 %    Socioeconomic Pre  26.79 %    Socioeconomic Post  22.75 %    Socioeconomic % Change   -15.08 %    Psych/Spiritual Pre  23.64 %    Psych/Spiritual Post  27.5 %    Psych/Spiritual % Change  16.33 %    Family Pre  26.9 %    Family Post  28.8 %    Family % Change  7.06 %    GLOBAL Pre  24.59 %    GLOBAL Post  25.91 %    GLOBAL % Change  5.37 %       Personal Goals: Goals established at orientation with interventions provided to work toward goal. Personal Goals and Risk Factors at Admission -  05/05/17 1410      Core Components/Risk Factors/Patient Goals on Admission    Weight Management  Yes;Obesity    Intervention  Weight Management: Develop a combined nutrition and exercise program designed to reach desired caloric intake, while maintaining appropriate intake of nutrient and fiber, sodium and fats, and appropriate energy expenditure required for the weight goal.;Weight Management: Provide education and appropriate resources to help participant work on and attain dietary goals.;Weight Management/Obesity: Establish reasonable short term and long term weight goals.;Obesity: Provide education and appropriate resources to help participant work on and attain dietary goals.    Admit Weight  222 lb 12.8 oz (101.1 kg)    Goal Weight: Short Term  220 lb (99.8 kg)    Goal Weight: Long Term  200 lb (90.7 kg)    Expected Outcomes  Short Term: Continue to assess and modify  interventions until short term weight is achieved;Long Term: Adherence to nutrition and physical activity/exercise program aimed toward attainment of established weight goal;Weight Loss: Understanding of general recommendations for a balanced deficit meal plan, which promotes 1-2 lb weight loss per week and includes a negative energy balance of [id number] kcal/d    Tobacco Cessation  Yes Quit 4 months ago   Quit 4 months ago   Number of packs per day  1    Intervention  Assist the participant in steps to quit. Provide individualized education and counseling about committing to Tobacco Cessation, relapse prevention, and pharmacological support that can be provided by physician.;Advice worker, assist with locating and accessing local/national Quit Smoking programs, and support quit date choice.    Expected Outcomes  Short Term: Will demonstrate readiness to quit, by selecting a quit date.;Long Term: Complete abstinence from all tobacco products for at least 12 months from quit date.;Short Term: Will quit all tobacco product use, adhering to prevention of relapse plan.    Hypertension  Yes    Intervention  Provide education on lifestyle modifcations including regular physical activity/exercise, weight management, moderate sodium restriction and increased consumption of fresh fruit, vegetables, and low fat dairy, alcohol moderation, and smoking cessation.;Monitor prescription use compliance.    Expected Outcomes  Short Term: Continued assessment and intervention until BP is < 140/17m HG in hypertensive participants. < 130/826mHG in hypertensive participants with diabetes, heart failure or chronic kidney disease.;Long Term: Maintenance of blood pressure at goal levels.    Lipids  Yes    Intervention  Provide education and support for participant on nutrition & aerobic/resistive exercise along with prescribed medications to achieve LDL <7041mHDL >74m36m  Expected Outcomes  Short Term:  Participant states understanding of desired cholesterol values and is compliant with medications prescribed. Participant is following exercise prescription and nutrition guidelines.;Long Term: Cholesterol controlled with medications as prescribed, with individualized exercise RX and with personalized nutrition plan. Value goals: LDL < 70mg48mL > 40 mg.        Personal Goals Discharge: Goals and Risk Factor Review    Row Name 06/13/17 0927 07/07/17 0855           Core Components/Risk Factors/Patient Goals Review   Personal Goals Review  Weight Management/Obesity;Hypertension;Lipids  Weight Management/Obesity;Hypertension;Lipids      Review  Ed is maintaining his current weight.  He has met with the dietician.  He is taking all medications as directed.  His BP is still a little higher than ideal.  Ed is ready to graduate on Friday. He has maintained his weight and feels that he is  breathing better overall.  He continues to have slightly elevated blood pressures but they are regular and don't fluctuate much.        Expected Outcomes  Short - Ed will coontinue to exercise and take meds as directed.  Long - Ed will work with his Dr to get mubers at optimal levels.  Short: Graduate!  Long: Continue to work on risk factor modifications.         Exercise Goals and Review: Exercise Goals    Row Name 05/05/17 1415             Exercise Goals   Increase Physical Activity  Yes       Intervention  Provide advice, education, support and counseling about physical activity/exercise needs.;Develop an individualized exercise prescription for aerobic and resistive training based on initial evaluation findings, risk stratification, comorbidities and participant's personal goals.       Expected Outcomes  Achievement of increased cardiorespiratory fitness and enhanced flexibility, muscular endurance and strength shown through measurements of functional capacity and personal statement of participant.        Increase Strength and Stamina  Yes       Intervention  Provide advice, education, support and counseling about physical activity/exercise needs.;Develop an individualized exercise prescription for aerobic and resistive training based on initial evaluation findings, risk stratification, comorbidities and participant's personal goals.       Expected Outcomes  Achievement of increased cardiorespiratory fitness and enhanced flexibility, muscular endurance and strength shown through measurements of functional capacity and personal statement of participant.          Nutrition & Weight - Outcomes: Pre Biometrics - 05/05/17 1416      Pre Biometrics   Height  '6\' 2"'  (1.88 m)    Weight  222 lb 12.8 oz (101.1 kg)    Waist Circumference  44.5 inches    Hip Circumference  39.5 inches    Waist to Hip Ratio  1.13 %    BMI (Calculated)  28.7    Single Leg Stand  20.07 seconds      Post Biometrics - 07/04/17 1140       Post  Biometrics   Height  '6\' 2"'  (1.88 m)    Weight  219 lb (99.3 kg)    Waist Circumference  44 inches    Hip Circumference  42.5 inches    Waist to Hip Ratio  1.04 %    BMI (Calculated)  28.11    Single Leg Stand  11.35 seconds       Nutrition: Nutrition Therapy & Goals - 05/26/17 1428      Nutrition Therapy   Diet  Instructed on a meal plan based on heart healthy dietary guidelines and including diabetes dietary guidelines.    Protein (specify units)  8    Fiber  30 grams    Whole Grain Foods  3 servings    Saturated Fats  14 max. grams    Fruits and Vegetables  5 servings/day    Sodium  2000 grams 1500 mg-ideal   1500 mg-ideal     Personal Nutrition Goals   Nutrition Goal  Pour water off canned vegetables and replace with fresh water to reduce sodium.    Personal Goal #2  Decrease added salt. Try Morton lite salt and limit its use.    Personal Goal #3  If choose one meal that is high in fat, try to make lower fat choices at other meals that day.  Personal Goal #4   Switch to unsweetened tea added with Splenda or Sweet 'n low.      Intervention Plan   Intervention  Prescribe, educate and counsel regarding individualized specific dietary modifications aiming towards targeted core components such as weight, hypertension, lipid management, diabetes, heart failure and other comorbidities.;Nutrition handout(s) given to patient.    Expected Outcomes  Short Term Goal: Understand basic principles of dietary content, such as calories, fat, sodium, cholesterol and nutrients.;Short Term Goal: A plan has been developed with personal nutrition goals set during dietitian appointment.;Long Term Goal: Adherence to prescribed nutrition plan.       Nutrition Discharge: Nutrition Assessments - 07/11/17 0902      MEDFICTS Scores   Pre Score  140    Post Score  -- declined post assessment   declined post assessment      Education Questionnaire Score: Knowledge Questionnaire Score - 07/11/17 0902      Knowledge Questionnaire Score   Pre Score  16/28    Post Score  25/28       Goals reviewed with patient; copy given to patient.

## 2017-07-28 NOTE — Progress Notes (Signed)
Cardiac Individual Treatment Plan  Patient Details  Name: Antonio Barnes MRN: [id number] Date of Birth: 1945/11/18 Referring Provider:     Cardiac Rehab from 05/05/2017 in [facility]iac [facility] Re[facility]  Referring Provider  Bartholome Bill MD      Initial Encounter Date:    Cardiac Rehab from 05/05/2017 in [facility] Cardiac and Pulmonary Rehab  Date  05/05/17  Referring Provider  Bartholome Bill MD      Visit Diagnosis: Status post coronary artery stent placement  Patient's Home Medications on Admission:  Current Outpatient Medications:  .  Adalimumab (HUMIRA) 40 MG/0.8ML PSKT, Inject 40 mg into the skin. Every 2 weeks as needed for psoriasis, Disp: , Rfl:  .  albuterol (PROVENTIL HFA;VENTOLIN HFA) 108 (90 Base) MCG/ACT inhaler, Inhale 2 puffs into the lungs every 6 (six) hours as needed for wheezing or shortness of breath., Disp: , Rfl:  .  amLODipine-benazepril (LOTREL) 5-20 MG per capsule, Take 1 capsule by mouth daily., Disp: , Rfl:  .  aspirin EC 81 MG tablet, Take 81 mg by mouth daily., Disp: , Rfl:  .  betamethasone dipropionate (DIPROLENE) 0.05 % cream, Apply 1 application topically daily as needed (psoriasis). , Disp: , Rfl:  .  carvedilol (COREG) 25 MG tablet, Take 12.5 mg by mouth 2 (two) times daily with a meal. , Disp: , Rfl:  .  clopidogrel (PLAVIX) 75 MG tablet, Take 1 tablet (75 mg total) by mouth daily with breakfast., Disp: 30 tablet, Rfl: 11 .  ezetimibe (ZETIA) 10 MG tablet, Take 10 mg by mouth daily., Disp: , Rfl:  .  fexofenadine (WAL-FEX) 180 MG tablet, Take 180 mg by mouth daily as needed for allergies or rhinitis., Disp: , Rfl:  .  hydrochlorothiazide (HYDRODIURIL) 25 MG tablet, Take 25 mg by mouth daily., Disp: , Rfl:  .  levothyroxine (SYNTHROID, LEVOTHROID) 75 MCG tablet, Take 75 mcg by mouth daily before breakfast., Disp: , Rfl:  .  naproxen sodium (ANAPROX) 220 MG tablet, Take 440 mg by mouth daily as needed (pain). , Disp: , Rfl:  .  sildenafil (REVATIO) 20 MG  tablet, Take 40-100 mg by mouth daily as needed (erectile dysfunction). , Disp: , Rfl:   Past Medical History: Past Medical History:  Diagnosis Date  . Asthma   . Diabetes mellitus without complication ([place])   . Hypertension   . Myocardial infarct ([place])   . Prostate cancer ([place])     Tobacco Use: Social History   Tobacco Use  Smoking Status Former Smoker  . Packs/day: 2.00  . Years: 30.00  . Pack years: 60.00  Smokeless Tobacco Never Used  Tobacco Comment   Quit 4 months ago  after restarting 10 years ago 1 pack a day    Labs: Recent Review Flowsheet Data    There is no flowsheet data to display.       Exercise Target Goals:    Exercise Program Goal: Individual exercise prescription set with THRR, safety & activity barriers. Participant demonstrates ability to understand and report RPE using BORG scale, to self-measure pulse accurately, and to acknowledge the importance of the exercise prescription.  Exercise Prescription Goal: Starting with aerobic activity 30 plus minutes a day, 3 days per week for initial exercise prescription. Provide home exercise prescription and guidelines that participant acknowledges understanding prior to discharge.  Activity Barriers & Risk Stratification: Activity Barriers & Cardiac Risk Stratification - 05/05/17 1343      Activity Barriers & Cardiac Risk Stratification   Activity  Barriers  Joint Problems;Deconditioning;Muscular Weakness;Shortness of Breath Right hip  - standing or moving can cause pain in hip and down his leg. Has had cortisone shots that help alleviate the pain, possible  [employer]ica   Right hip  - standing or moving can cause pain in hip and down his leg. Has had cortisone shots that help alleviate the pain, possible  sciattica   Cardiac Risk Stratification  High       6 Minute Walk: 6 Minute Walk    Row Name 05/05/17 1411 07/04/17 1141       6 Minute Walk   Phase  Initial  Discharge    Distance  1560 feet  1658  feet    Distance % Change  -  6 %    Distance Feet Change  -  98 ft    Walk Time  6 minutes  6 minutes    # of Rest Breaks  0  0    MPH  2.95  3.14    METS  3.57  3.99    RPE  12  11    Perceived Dyspnea   2  -    VO2 Peak  12.5  13.98    Symptoms  Yes (comment)  No    Comments  SOB  -    Resting HR  53 bpm  68 bpm    Resting BP  103/74  132/72    Resting Oxygen Saturation   -  100 %    Exercise Oxygen Saturation  during 6 min walk  -  99 %    Max Ex. HR  108 bpm  110 bpm    Max Ex. BP  132/64  156/80    2 Minute Post BP  122/60  -       Oxygen Initial Assessment:   Oxygen Re-Evaluation:   Oxygen Discharge (Final Oxygen Re-Evaluation):   Initial Exercise Prescription: Initial Exercise Prescription - 05/05/17 1400      Date of Initial Exercise RX and Referring Provider   Date  05/05/17    Referring Provider  Bartholome Bill MD      Treadmill   MPH  2.9    Grade  0.5    Minutes  15    METs  3.42      Recumbant Bike   Level  3    RPM  35    Watts  50    Minutes  15    METs  3.5      T5 Nustep   Level  3    SPM  80    Minutes  15    METs  3      Prescription Details   Frequency (times per week)  3    Duration  Progress to 45 minutes of aerobic exercise without signs/symptoms of physical distress      Intensity   THRR 40-80% of Max Heartrate  93-132    Ratings of Perceived Exertion  11-13    Perceived Dyspnea  0-4      Progression   Progression  Continue to progress workloads to maintain intensity without signs/symptoms of physical distress.      Resistance Training   Training Prescription  Yes    Weight  4 lbs    Reps  10-15       Perform Capillary Blood Glucose checks as needed.  Exercise Prescription Changes: Exercise Prescription Changes    Row Name 05/05/17 1300 05/16/17 0800 05/21/17 1500  06/04/17 1500 06/19/17 1000     Response to Exercise   Blood Pressure (Admit)  130/74  -  118/74  124/64  114/60   Blood Pressure (Exercise)  132/64   -  130/72  124/74  142/84   Blood Pressure (Exit)  122/60  -  124/76  126/60  112/60   Heart Rate (Admit)  53 bpm  -  64 bpm  67 bpm  82 bpm   Heart Rate (Exercise)  108 bpm  -  95 bpm  115 bpm  131 bpm   Heart Rate (Exit)  68 bpm  -  62 bpm  72 bpm  75 bpm   Oxygen Saturation (Admit)  99 %  -  -  -  -   Oxygen Saturation (Exercise)  99 %  -  -  -  -   Rating of Perceived Exertion (Exercise)  12  -  _0 Perceived Dyspnea (Exercise)  2  -  -  -  -   Symptoms  SOB  -  none  none  none   Comments  walk test results  -  -  -  -   Duration  -  -  Continue with 45 min of aerobic exercise without signs/symptoms of physical distress.  Continue with 45 min of aerobic exercise without signs/symptoms of physical distress.  Continue with 45 min of aerobic exercise without signs/symptoms of physical distress.   Intensity  -  -  THRR unchanged  THRR unchanged  THRR unchanged     Progression   Progression  -  -  Continue to progress workloads to maintain intensity without signs/symptoms of physical distress.  Continue to progress workloads to maintain intensity without signs/symptoms of physical distress.  Continue to progress workloads to maintain intensity without signs/symptoms of physical distress.   Average METs  -  -  3.46  3.3  3.36     Resistance Training   Training Prescription  -  -  Yes  Yes  Yes   Weight  -  -  4 lbs  5 lbs  7 lbs   Reps  -  -  10-15  10-15  10-15     Interval Training   Interval Training  -  -  No  No  No     Treadmill   MPH  -  -  _1 Grade  -  -  _2 Minutes  -  -  _3 METs  -  -  4.12  4.12  4.12     Recumbant Bike   Level  -  -  _4 Watts  -  -  64  50  50   Minutes  -  -  _5 METs  -  -  4.05  3.57  3.57     T5 Nustep   Level  -  -  _6 Minutes  -  -  _7 METs  -  -  2.2  2.2  2.4     Home Exercise Plan   Plans to continue exercise at  -  Home (comment) walking, Bowflex, and handweights  Home  (comment) walking, Bowflex, and handweights  Home (comment) walking, Bowflex, and handweights  Home (comment) walking, Bowflex, and handweights   Frequency  -  Add 1 additional day to program exercise sessions.  Add 1 additional day to program exercise sessions.  Add 1 additional day to program exercise sessions.  Add 1 additional day to program exercise sessions.   Initial Home Exercises Provided  -  05/16/17  05/16/17  05/16/17  05/16/17   Row Name 07/02/17 1500 07/16/17 1500           Response to Exercise   Blood Pressure (Admit)  152/86  122/70      Blood Pressure (Exercise)  162/82  126/70      Blood Pressure (Exit)  128/80  128/78      Heart Rate (Admit)  70 bpm  85 bpm      Heart Rate (Exercise)  109 bpm  101 bpm      Heart Rate (Exit)  64 bpm  69 bpm      Rating of Perceived Exertion (Exercise)  14  12      Symptoms  none  none      Duration  Continue with 45 min of aerobic exercise without signs/symptoms of physical distress.  Continue with 45 min of aerobic exercise without signs/symptoms of physical distress.      Intensity  THRR unchanged  THRR unchanged        Progression   Progression  Continue to progress workloads to maintain intensity without signs/symptoms of physical distress.  Continue to progress workloads to maintain intensity without signs/symptoms of physical distress.      Average METs  3.85  3.41        Resistance Training   Training Prescription  Yes  Yes      Weight  7 lbs  7 lbs      Reps  10-15  10-15        Interval Training   Interval Training  No  No        Treadmill   MPH  3  3      Grade  2  2      Minutes  15  15      METs  4.12  4.12        Recumbant Bike   Level  9  9      Watts  51  43      Minutes  15  15      METs  3.57  3.42        T5 Nustep   Level  -  7      Minutes  -  15      METs  -  2.7        Home Exercise Plan   Plans to continue exercise at  Home (comment) walking, Bowflex, and handweights  Home (comment) walking,  Bowflex, and handweights      Frequency  Add 1 additional day to program exercise sessions.  Add 2 additional days to program exercise sessions.      Initial Home Exercises Provided  05/16/17  05/16/17         Exercise Comments: Exercise Comments    Row Name 05/09/17 0854 05/23/17 0813         Exercise Comments   First full day of exercise!  Patient was oriented to gym and equipment including functions, settings, policies, and procedures.  Patient's individual exercise prescription and treatment plan were reviewed.  All starting workloads were established based on the results of  the 6 minute walk test done at initial orientation visit.  The plan for exercise progression was also introduced and progression will be customized based on patient's performance and goals.  Started interval training today!         Exercise Goals and Review: Exercise Goals    Row Name 05/05/17 1415             Exercise Goals   Increase Physical Activity  Yes       Intervention  Provide advice, education, support and counseling about physical activity/exercise needs.;Develop an individualized exercise prescription for aerobic and resistive training based on initial evaluation findings, risk stratification, comorbidities and participant's personal goals.       Expected Outcomes  Achievement of increased cardiorespiratory fitness and enhanced flexibility, muscular endurance and strength shown through measurements of functional capacity and personal statement of participant.       Increase Strength and Stamina  Yes       Intervention  Provide advice, education, support and counseling about physical activity/exercise needs.;Develop an individualized exercise prescription for aerobic and resistive training based on initial evaluation findings, risk stratification, comorbidities and participant's personal goals.       Expected Outcomes  Achievement of increased cardiorespiratory fitness and enhanced flexibility,  muscular endurance and strength shown through measurements of functional capacity and personal statement of participant.          Exercise Goals Re-Evaluation : Exercise Goals Re-Evaluation    Row Name 05/16/17 1655 05/21/17 1525 06/04/17 1509 06/19/17 1032 07/02/17 1523     Exercise Goal Re-Evaluation   Exercise Goals Review  Increase Physical Activity;Increase Strength and Stamina;Able to understand and use rate of perceived exertion (RPE) scale;Knowledge and understanding of Target Heart Rate Range (THRR);Able to check pulse independently;Understanding of Exercise Prescription  Increase Strength and Stamina;Increase Physical Activity  Increase Strength and Stamina;Increase Physical Activity  Increase Strength and Stamina;Increase Physical Activity  Increase Physical Activity;Increase Strength and Stamina   Comments  Reviewed home exercise with pt today.  Pt plans to walk, bowflex and handweights for exercise.  Reviewed THR, pulse, RPE, sign and symptoms, NTG use, and when to call 911 or MD.  Also discussed weather considerations and indoor options.  Pt voiced understanding.  Antonio Barnes is off to a good start in rehab.   He is already up to 64 watts on the recumbent bike.  We will talk about possibly adding in intervals to prescription.  We will continue to monitor his progression.  Antonio Barnes continues to do well in rehab.  He is now up to level 9 on the recumbent bike.  We will continue to monitor his progression.   Antonio Barnes has been doing well in rehab.  He is up to level 7 on the T5 NuStep.  He prefers the higher workloads versus intervals.  We will continue to monitor his progress.   Antonio Barnes continues to use higher resistance on the bike and NS.     Expected Outcomes  Short - Add one day of exercise outside class times Long - maintain independent exercise    Short: Add in intervals.  Long: Make exercise part of routine.   Short: Talk about intervals again.  Long: Continue to exercise regularly.  Short: Continue to work hard  in rehab.  Long: Continue to exercise regularly.   Short - Antonio Barnes will maintain fitness gains.  Long - Antonio Barnes will exercise 3-5 days between class and home exercise.   [place] Name 07/07/17 [phone]  Exercise Goal Re-Evaluation   Exercise Goals Review  Increase Physical Activity;Increase Strength and Stamina;Understanding of Exercise Prescription       Comments  Antonio Barnes graduates on Friday.  He is planning to join the [place] with his [place] and is waiting on a call from [employer] to set up his orientation appointment.  He is feeling stronger and has more stamina.  He likes that exercise makes him get up and out of bed each day.  He is also breathing better overall!       Expected Outcomes  Short: Graduate!  Long: Continue to exercise independently to maintain what he has gained during rehab.          Discharge Exercise Prescription (Final Exercise Prescription Changes): Exercise Prescription Changes - 07/16/17 1500      Response to Exercise   Blood Pressure (Admit)  122/70    Blood Pressure (Exercise)  126/70    Blood Pressure (Exit)  128/78    Heart Rate (Admit)  85 bpm    Heart Rate (Exercise)  101 bpm    Heart Rate (Exit)  69 bpm    Rating of Perceived Exertion (Exercise)  12    Symptoms  none    Duration  Continue with 45 min of aerobic exercise without signs/symptoms of physical distress.    Intensity  THRR unchanged      Progression   Progression  Continue to progress workloads to maintain intensity without signs/symptoms of physical distress.    Average METs  3.41      Resistance Training   Training Prescription  Yes    Weight  7 lbs    Reps  10-15      Interval Training   Interval Training  No      Treadmill   MPH  3    Grade  2    Minutes  15    METs  4.12      Recumbant Bike   Level  9    Watts  43    Minutes  15    METs  3.42      T5 Nustep   Level  7    Minutes  15    METs  2.7      Home Exercise Plan   Plans to continue exercise at  Home  (comment) walking, Bowflex, and handweights   walking, Bowflex, and handweights   Frequency  Add 2 additional days to program exercise sessions.    Initial Home Exercises Provided  05/16/17       Nutrition:  Target Goals: Understanding of nutrition guidelines, daily intake of sodium <155m, cholesterol <20100m calories 30% from fat and 7% or less from saturated fats, daily to have 5 or more servings of fruits and vegetables.  Biometrics: Pre Biometrics - 05/05/17 1416      Pre Biometrics   Height  _0  (1.88 m)    Weight  222 lb 12.8 oz (101.1 kg)    Waist Circumference  44.5 inches    Hip Circumference  39.5 inches    Waist to Hip Ratio  1.13 %    BMI (Calculated)  28.7    Single Leg Stand  20.07 seconds      Post Biometrics - 07/04/17 1140       Post  Biometrics   Height  _1  (1.88 m)    Weight  219 lb (99.3 kg)    Waist Circumference  44 inches    Hip Circumference  42.5 inches    Waist to Hip Ratio  1.04 %    BMI (Calculated)  28.11    Single Leg Stand  11.35 seconds       Nutrition Therapy Plan and Nutrition Goals: Nutrition Therapy & Goals - 05/26/17 1428      Nutrition Therapy   Diet  Instructed on a meal plan based on heart healthy dietary guidelines and including diabetes dietary guidelines.    Protein (specify units)  8    Fiber  30 grams    Whole Grain Foods  3 servings    Saturated Fats  14 max. grams    Fruits and Vegetables  5 servings/day    Sodium  2000 grams 1500 mg-ideal   1500 mg-ideal     Personal Nutrition Goals   Nutrition Goal  Pour water off canned vegetables and replace with fresh water to reduce sodium.    Personal Goal #2  Decrease added salt. Try Morton lite salt and limit its use.    Personal Goal #3  If choose one meal that is high in fat, try to make lower fat choices at other meals that day.    Personal Goal #4  Switch to unsweetened tea added with Splenda or Sweet 'n low.      Intervention Plan   Intervention  Prescribe,  educate and counsel regarding individualized specific dietary modifications aiming towards targeted core components such as weight, hypertension, lipid management, diabetes, heart failure and other comorbidities.;Nutrition handout(s) given to patient.    Expected Outcomes  Short Term Goal: Understand basic principles of dietary content, such as calories, fat, sodium, cholesterol and nutrients.;Short Term Goal: A plan has been developed with personal nutrition goals set during dietitian appointment.;Long Term Goal: Adherence to prescribed nutrition plan.       Nutrition Discharge: Rate Your Plate Scores: Nutrition Assessments - 07/11/17 0902      MEDFICTS Scores   Pre Score  140    Post Score  -- declined post assessment   declined post assessment      Nutrition Goals Re-Evaluation: Nutrition Goals Re-Evaluation    [place] Name 06/13/17 0930 06/25/17 0619 07/07/17 0858         Goals   Nutrition Goal  -  -  Pour water off canned vegetables and replace with fresh water to reduce sodium.     Comment  Antonio Barnes met with the RD but has not started any of her suggestions.  He eats out a lot and admits to eating late at night.    -  Antonio Barnes has continued to only make minor changes to his diet.  He enjoys eating out, but is trying to be more  mindful of his food choices.  He is also trying to cut back on his late night snacking.      Expected Outcome  Short - Antonio Barnes will drink more water and leave off eating wings in the next week.  Long - Antonio Barnes will make small changes to his diet to work towards a healthier diet overall.  Heart healthier eating  Short: Continue to make changes.  Long: Continue to follow a heart healthy diet.        Personal Goal #2 Re-Evaluation   Personal Goal #2  -  Antonio Barnes is trying to decrease his salt intake  -       Personal Goal #3 Re-Evaluation   Personal Goal #3  -  Antonio "Antonio Barnes" is trying to eat somewhat healthier  -  Personal Goal #4 Re-Evaluation   Personal Goal #4  -  is trying to switch    -        Nutrition Goals Discharge (Final Nutrition Goals Re-Evaluation): Nutrition Goals Re-Evaluation - 07/07/17 0858      Goals   Nutrition Goal  Pour water off canned vegetables and replace with fresh water to reduce sodium.    Comment  Antonio Barnes has continued to only make minor changes to his diet.  He enjoys eating out, but is trying to be more  mindful of his food choices.  He is also trying to cut back on his late night snacking.     Expected Outcome  Short: Continue to make changes.  Long: Continue to follow a heart healthy diet.        Psychosocial: Target Goals: Acknowledge presence or absence of significant depression and/or stress, maximize coping skills, provide positive support system. Participant is able to verbalize types and ability to use techniques and skills needed for reducing stress and depression.   Initial Review & Psychosocial Screening: Initial Psych Review & Screening - 05/05/17 1413      Initial Review   Current issues with  None Identified      Family Dynamics   Good Support System?  Yes children      children        Barriers   Psychosocial barriers to participate in program  There are no identifiable barriers or psychosocial needs.;The patient should benefit from training in stress management and relaxation.      Screening Interventions   Interventions  Encouraged to exercise;Provide feedback about the scores to participant;To provide support and resources with identified psychosocial needs       Quality of Life Scores:  Quality of Life - 07/11/17 0903      Quality of Life Scores   Health/Function Pre  23.23 %    Health/Function Post  25.85 %    Health/Function % Change  11.28 %    Socioeconomic Pre  26.79 %    Socioeconomic Post  22.75 %    Socioeconomic % Change   -15.08 %    Psych/Spiritual Pre  23.64 %    Psych/Spiritual Post  27.5 %    Psych/Spiritual % Change  16.33 %    Family Pre  26.9 %    Family Post  28.8 %    Family % Change   7.06 %    GLOBAL Pre  24.59 %    GLOBAL Post  25.91 %    GLOBAL % Change  5.37 %       PHQ-9: Recent Review Flowsheet Data    Depression screen [facility] 2/9 07/11/2017 05/05/2017   Decreased Interest 0 1   Down, Depressed, Hopeless 0 0   PHQ - 2 Score 0 1   Altered sleeping 1 1   Tired, decreased energy 1 1   Change in appetite 1 2   Feeling bad or failure about yourself  1 0   Trouble concentrating 0 0   Moving slowly or fidgety/restless 0 0   Suicidal thoughts 0 0   PHQ-9 Score 4 5   Difficult doing work/chores Not difficult at all Not difficult at all     Interpretation of Total Score  Total Score Depression Severity:  1-4 = Minimal depression, 5-9 = Mild depression, 10-14 = Moderate depression, 15-19 = Moderately severe depression, 20-27 = Severe depression   Psychosocial Evaluation and Intervention: Psychosocial Evaluation - 05/21/17 1062  Psychosocial Evaluation & Interventions   Interventions  Encouraged to exercise with the program and follow exercise prescription    Comments  Counselor met with Mr. Goyer (Antonio Barnes) today for initial psychosocial evaluation.  He is a 71 year old who had a stent inserted several weeks ago and was encouraged to come to this program by his Dr.  He has a strong support system with a son and daughter who live close by.  Antonio Barnes has minimal health issues and reports sleeping well and eating "too good."  He denies a history of depression or anxiety or any current symptoms and has minimal stress in his life at this time.  He has goals to lose weight and get in the habit of exercising more consistently.  Counselor discussed some options for Antonio Barnes to consider once he completes this program to continue this positive habit.  Staff will follow with Antonio Barnes throughout the course of this program.     Expected Outcomes  Antonio Barnes will benefit from consistent exercise to achieve his stated goals.  The educational and psychoeducational components of this program will be helpful in  understanding and coping with his illness and improving his eating habits.  Antonio Barnes will see the dietician to address his weight loss goals.      Continue Psychosocial Services   Follow up required by staff       Psychosocial Re-Evaluation: Psychosocial Re-Evaluation    [place] Name 06/13/17 0932             Psychosocial Re-Evaluation   Comments  Antonio Barnes doesn't report any new concerns.       Expected Outcomes  Short - Antonio Barnes will continue to exercise.  Long - Antonio Barnes will complete HT and use the stress management skills to maintain his low level of stress.            Psychosocial Discharge (Final Psychosocial Re-Evaluation): Psychosocial Re-Evaluation - 06/13/17 0932      Psychosocial Re-Evaluation   Comments  Antonio Barnes doesn't report any new concerns.    Expected Outcomes  Short - Antonio Barnes will continue to exercise.  Long - Antonio Barnes will complete HT and use the stress management skills to maintain his low level of stress.         Vocational Rehabilitation: Provide vocational rehab assistance to qualifying candidates.   Vocational Rehab Evaluation & Intervention: Vocational Rehab - 05/05/17 1420      Initial Vocational Rehab Evaluation & Intervention   Assessment shows need for Vocational Rehabilitation  No       Education: Education Goals: Education classes will be provided on a variety of topics geared toward better understanding of heart health and risk factor modification. Participant will state understanding/return demonstration of topics presented as noted by education test scores.  Learning Barriers/Preferences: Learning Barriers/Preferences - 05/05/17 1417      Learning Barriers/Preferences   Learning Barriers  Hearing Does not have hearing aids   Does not have hearing aids   Learning Preferences  None       Education Topics: General Nutrition Guidelines/Fats and Fiber: -Group instruction provided by verbal, written material, models and posters to present the general guidelines for heart healthy  nutrition. Gives an explanation and review of dietary fats and fiber.   Cardiac Rehab from 07/07/2017 in [facility] Cardiac and Pulmonary Rehab  Date  06/30/17  Educator  CR  Instruction Review Code  1- Verbalizes Understanding      Controlling Sodium/Reading Food Labels: -Group verbal and written material supporting the discussion of  sodium use in heart healthy nutrition. Review and explanation with models, verbal and written materials for utilization of the food label.   Cardiac Rehab from 07/07/2017 in [facility] Cardiac and Pulmonary Rehab  Date  05/12/17  Educator  CR  Instruction Review Code  1- Verbalizes Understanding      Exercise Physiology & Risk Factors: - Group verbal and written instruction with models to review the exercise physiology of the cardiovascular system and associated critical values. Details cardiovascular disease risk factors and the goals associated with each risk factor.   Cardiac Rehab from 07/07/2017 in [facility] Cardiac and Pulmonary Rehab  Date  05/21/17  Educator  [facility]  Instruction Review Code  1- Verbalizes Understanding      Aerobic Exercise & Resistance Training: - Gives group verbal and written discussion on the health impact of inactivity. On the components of aerobic and resistive training programs and the benefits of this training and how to safely progress through these programs.   Cardiac Rehab from 07/07/2017 in [facility] Cardiac and Pulmonary Rehab  Date  05/26/17  Educator  [facility]  Instruction Review Code  1- Verbalizes Understanding      Flexibility, Balance, General Exercise Guidelines: - Provides group verbal and written instruction on the benefits of flexibility and balance training programs. Provides general exercise guidelines with specific guidelines to those with heart or lung disease. Demonstration and skill practice provided.   Cardiac Rehab from 07/07/2017 in [facility] Cardiac and Pulmonary Rehab  Date  05/28/17  Educator  [facility]  Instruction Review Code   1- Verbalizes Understanding      Stress Management: - Provides group verbal and written instruction about the health risks of elevated stress, cause of high stress, and healthy ways to reduce stress.   Depression: - Provides group verbal and written instruction on the correlation between heart/lung disease and depressed mood, treatment options, and the stigmas associated with seeking treatment.   Cardiac Rehab from 07/07/2017 in [facility] Cardiac and Pulmonary Rehab  Date  07/02/17  Educator  [facility]  Instruction Review Code  1- Verbalizes Understanding      Anatomy & Physiology of the Heart: - Group verbal and written instruction and models provide basic cardiac anatomy and physiology, with the coronary electrical and arterial systems. Review of: AMI, Angina, Valve disease, Heart Failure, Cardiac Arrhythmia, Pacemakers, and the ICD.   Cardiac Procedures: - Group verbal and written instruction to review commonly prescribed medications for heart disease. Reviews the medication, class of the drug, and side effects. Includes the steps to properly store meds and maintain the prescription regimen. (beta blockers and nitrates)   Cardiac Rehab from 07/07/2017 in [facility] Cardiac and Pulmonary Rehab  Date  06/09/17  Educator  CE  Instruction Review Code  1- Verbalizes Understanding      Cardiac Medications I: - Group verbal and written instruction to review commonly prescribed medications for heart disease. Reviews the medication, class of the drug, and side effects. Includes the steps to properly store meds and maintain the prescription regimen.   Cardiac Rehab from 07/07/2017 in [facility] Cardiac and Pulmonary Rehab  Date  06/16/17  Educator  [facility]  Instruction Review Code  1- Verbalizes Understanding      Cardiac Medications II: -Group verbal and written instruction to review commonly prescribed medications for heart disease. Reviews the medication, class of the drug, and side effects. (all other drug  classes)    Go Sex-Intimacy & Heart Disease, Get SMART - Goal Setting: - Group verbal and written  instruction through game format to discuss heart disease and the return to sexual intimacy. Provides group verbal and written material to discuss and apply goal setting through the application of the S.M.A.R.T. Method.   Cardiac Rehab from 07/07/2017 in [facility] Cardiac and Pulmonary Rehab  Date  06/09/17  Educator  CE  Instruction Review Code  1- Verbalizes Understanding      Other Matters of the Heart: - Provides group verbal, written materials and models to describe Heart Failure, Angina, Valve Disease, Peripheral Artery Disease, and Diabetes in the realm of heart disease. Includes description of the disease process and treatment options available to the cardiac patient.   Exercise & Equipment Safety: - Individual verbal instruction and demonstration of equipment use and safety with use of the equipment.   Cardiac Rehab from 07/07/2017 in [facility] Cardiac and Pulmonary Rehab  Date  05/05/17  Educator  SB  Instruction Review Code  1- Verbalizes Understanding      Infection Prevention: - Provides verbal and written material to individual with discussion of infection control including proper hand washing and proper equipment cleaning during exercise session.   Cardiac Rehab from 07/07/2017 in [facility] Cardiac and Pulmonary Rehab  Date  05/05/17  Educator  SB  Instruction Review Code  1- Verbalizes Understanding      Falls Prevention: - Provides verbal and written material to individual with discussion of falls prevention and safety.   Cardiac Rehab from 07/07/2017 in [facility] Cardiac and Pulmonary Rehab  Date  05/05/17  Educator  SB  Instruction Review Code (retired)  2- meets goals/outcomes  Instruction Review Code  1- Science writer Understanding      Diabetes: - Individual verbal and written instruction to review signs/symptoms of diabetes, desired ranges of glucose level fasting, after  meals and with exercise. Acknowledge that pre and post exercise glucose checks will be done for 3 sessions at entry of program.   Other: -Provides group and verbal instruction on various topics (see comments)    Knowledge Questionnaire Score: Knowledge Questionnaire Score - 07/11/17 0902      Knowledge Questionnaire Score   Pre Score  16/28    Post Score  25/28       Core Components/Risk Factors/Patient Goals at Admission: Personal Goals and Risk Factors at Admission - 05/05/17 1410      Core Components/Risk Factors/Patient Goals on Admission    Weight Management  Yes;Obesity    Intervention  Weight Management: Develop a combined nutrition and exercise program designed to reach desired caloric intake, while maintaining appropriate intake of nutrient and fiber, sodium and fats, and appropriate energy expenditure required for the weight goal.;Weight Management: Provide education and appropriate resources to help participant work on and attain dietary goals.;Weight Management/Obesity: Establish reasonable short term and long term weight goals.;Obesity: Provide education and appropriate resources to help participant work on and attain dietary goals.    Admit Weight  222 lb 12.8 oz (101.1 kg)    Goal Weight: Short Term  220 lb (99.8 kg)    Goal Weight: Long Term  200 lb (90.7 kg)    Expected Outcomes  Short Term: Continue to assess and modify interventions until short term weight is achieved;Long Term: Adherence to nutrition and physical activity/exercise program aimed toward attainment of established weight goal;Weight Loss: Understanding of general recommendations for a balanced deficit meal plan, which promotes 1-2 lb weight loss per week and includes a negative energy balance of [phone] kcal/d    Tobacco Cessation  Yes Quit 4 months  ago   Quit 4 months ago   Number of packs per day  1    Intervention  Assist the participant in steps to quit. Provide individualized education and  counseling about committing to Tobacco Cessation, relapse prevention, and pharmacological support that can be provided by physician.;Advice worker, assist with locating and accessing local/national Quit Smoking programs, and support quit date choice.    Expected Outcomes  Short Term: Will demonstrate readiness to quit, by selecting a quit date.;Long Term: Complete abstinence from all tobacco products for at least 12 months from quit date.;Short Term: Will quit all tobacco product use, adhering to prevention of relapse plan.    Hypertension  Yes    Intervention  Provide education on lifestyle modifcations including regular physical activity/exercise, weight management, moderate sodium restriction and increased consumption of fresh fruit, vegetables, and low fat dairy, alcohol moderation, and smoking cessation.;Monitor prescription use compliance.    Expected Outcomes  Short Term: Continued assessment and intervention until BP is < 140/32m HG in hypertensive participants. < 130/869mHG in hypertensive participants with diabetes, heart failure or chronic kidney disease.;Long Term: Maintenance of blood pressure at goal levels.    Lipids  Yes    Intervention  Provide education and support for participant on nutrition & aerobic/resistive exercise along with prescribed medications to achieve LDL <704mHDL >21m36m  Expected Outcomes  Short Term: Participant states understanding of desired cholesterol values and is compliant with medications prescribed. Participant is following exercise prescription and nutrition guidelines.;Long Term: Cholesterol controlled with medications as prescribed, with individualized exercise RX and with personalized nutrition plan. Value goals: LDL < 70mg82mL > 40 mg.       Core Components/Risk Factors/Patient Goals Review:  Goals and Risk Factor Review    Row Name 06/13/17 0927 [id number]           Core Components/Risk Factors/Patient Goals Review    Personal Goals Review  Weight Management/Obesity;Hypertension;Lipids  Weight Management/Obesity;Hypertension;Lipids      Review  Antonio Barnes is maintaining his current weight.  He has met with the dietician.  He is taking all medications as directed.  His BP is still a little higher than ideal.  Antonio Barnes is ready to graduate on Friday. He has maintained his weight and feels that he is breathing better overall.  He continues to have slightly elevated blood pressures but they are regular and don't fluctuate much.        Expected Outcomes  Short - Antonio Barnes will coontinue to exercise and take meds as directed.  Long - Antonio Barnes will work with his Dr to get mubers at optimal levels.  Short: Graduate!  Long: Continue to work on risk factor modifications.         Core Components/Risk Factors/Patient Goals at Discharge (Final Review):  Goals and Risk Factor Review - 07/07/17 0855      Core Components/Risk Factors/Patient Goals Review   Personal Goals Review  Weight Management/Obesity;Hypertension;Lipids    Review  Antonio Barnes is ready to graduate on Friday. He has maintained his weight and feels that he is breathing better overall.  He continues to have slightly elevated blood pressures but they are regular and don't fluctuate much.      Expected Outcomes  Short: Graduate!  Long: Continue to work on risk factor modifications.       ITP Comments: ITP Comments    Row Name 05/05/17 1334 05/28/17 1053 06/25/17 0621 06/27/17 0811 07/23/17 0621   ITP Comments  Medical review completed.  Initial ITP created and ready for review and changes if indicates by [facility] Director.  Documentation of diagnosis can be found in [facility] 04/24/2017 admission  30 day review. Continue with ITP unless directed changes per Medical Director review.    30 day review. Continue with ITP unless directed changes per Medical Director review.    Antonio Barnes left a early as he felt like he was coming down with a cold.  30 day review. Continue with ITP unless directed changes per Medical  Director review.    Saxon Name 07/28/17 1409           ITP Comments  Called to check up on status of return.  He has been out with the flu, but thought he had finished up at the end of October.  He still had one visit left, but would like to just be discharged at this time as he is already going to the other gym.  Discharge Summary routed and ITP created          Comments: Discharge ITP

## 2017-08-06 DIAGNOSIS — I10 Essential (primary) hypertension: Secondary | ICD-10-CM | POA: Diagnosis not present

## 2017-08-06 DIAGNOSIS — I2510 Atherosclerotic heart disease of native coronary artery without angina pectoris: Secondary | ICD-10-CM | POA: Diagnosis not present

## 2017-08-06 DIAGNOSIS — E782 Mixed hyperlipidemia: Secondary | ICD-10-CM | POA: Diagnosis not present

## 2017-09-22 DIAGNOSIS — J0100 Acute maxillary sinusitis, unspecified: Secondary | ICD-10-CM | POA: Diagnosis not present

## 2017-09-22 DIAGNOSIS — M5430 Sciatica, unspecified side: Secondary | ICD-10-CM | POA: Diagnosis not present

## 2017-10-17 DIAGNOSIS — Z79899 Other long term (current) drug therapy: Secondary | ICD-10-CM | POA: Diagnosis not present

## 2017-10-17 DIAGNOSIS — L820 Inflamed seborrheic keratosis: Secondary | ICD-10-CM | POA: Diagnosis not present

## 2017-10-17 DIAGNOSIS — L400 Psoriasis vulgaris: Secondary | ICD-10-CM | POA: Diagnosis not present

## 2017-11-10 DIAGNOSIS — I2510 Atherosclerotic heart disease of native coronary artery without angina pectoris: Secondary | ICD-10-CM | POA: Diagnosis not present

## 2017-11-10 DIAGNOSIS — E039 Hypothyroidism, unspecified: Secondary | ICD-10-CM | POA: Diagnosis not present

## 2017-11-10 DIAGNOSIS — R739 Hyperglycemia, unspecified: Secondary | ICD-10-CM | POA: Diagnosis not present

## 2017-11-12 DIAGNOSIS — Z0000 Encounter for general adult medical examination without abnormal findings: Secondary | ICD-10-CM | POA: Diagnosis not present

## 2017-11-12 DIAGNOSIS — E538 Deficiency of other specified B group vitamins: Secondary | ICD-10-CM | POA: Diagnosis not present

## 2017-11-12 DIAGNOSIS — Z125 Encounter for screening for malignant neoplasm of prostate: Secondary | ICD-10-CM | POA: Diagnosis not present

## 2017-11-12 DIAGNOSIS — E039 Hypothyroidism, unspecified: Secondary | ICD-10-CM | POA: Diagnosis not present

## 2017-11-12 DIAGNOSIS — E119 Type 2 diabetes mellitus without complications: Secondary | ICD-10-CM | POA: Diagnosis not present

## 2017-11-21 DIAGNOSIS — R220 Localized swelling, mass and lump, head: Secondary | ICD-10-CM | POA: Diagnosis not present

## 2017-11-28 DIAGNOSIS — R220 Localized swelling, mass and lump, head: Secondary | ICD-10-CM | POA: Diagnosis not present

## 2017-11-28 DIAGNOSIS — M159 Polyosteoarthritis, unspecified: Secondary | ICD-10-CM | POA: Diagnosis not present

## 2017-11-28 DIAGNOSIS — E119 Type 2 diabetes mellitus without complications: Secondary | ICD-10-CM | POA: Diagnosis not present

## 2018-01-27 DIAGNOSIS — D692 Other nonthrombocytopenic purpura: Secondary | ICD-10-CM | POA: Diagnosis not present

## 2018-01-27 DIAGNOSIS — L400 Psoriasis vulgaris: Secondary | ICD-10-CM | POA: Diagnosis not present

## 2018-02-05 DIAGNOSIS — R079 Chest pain, unspecified: Secondary | ICD-10-CM | POA: Diagnosis not present

## 2018-02-05 DIAGNOSIS — E119 Type 2 diabetes mellitus without complications: Secondary | ICD-10-CM | POA: Diagnosis not present

## 2018-02-05 DIAGNOSIS — E782 Mixed hyperlipidemia: Secondary | ICD-10-CM | POA: Diagnosis not present

## 2018-02-05 DIAGNOSIS — I10 Essential (primary) hypertension: Secondary | ICD-10-CM | POA: Diagnosis not present

## 2018-02-05 DIAGNOSIS — R0781 Pleurodynia: Secondary | ICD-10-CM | POA: Diagnosis not present

## 2018-02-05 DIAGNOSIS — I2510 Atherosclerotic heart disease of native coronary artery without angina pectoris: Secondary | ICD-10-CM | POA: Diagnosis not present

## 2018-02-05 DIAGNOSIS — R0789 Other chest pain: Secondary | ICD-10-CM | POA: Diagnosis not present

## 2018-02-06 DIAGNOSIS — R0789 Other chest pain: Secondary | ICD-10-CM | POA: Diagnosis not present

## 2018-02-06 DIAGNOSIS — R0781 Pleurodynia: Secondary | ICD-10-CM | POA: Diagnosis not present

## 2018-02-06 DIAGNOSIS — R079 Chest pain, unspecified: Secondary | ICD-10-CM | POA: Diagnosis not present

## 2018-02-10 DIAGNOSIS — E119 Type 2 diabetes mellitus without complications: Secondary | ICD-10-CM | POA: Diagnosis not present

## 2018-05-07 DIAGNOSIS — E039 Hypothyroidism, unspecified: Secondary | ICD-10-CM | POA: Diagnosis not present

## 2018-05-07 DIAGNOSIS — E119 Type 2 diabetes mellitus without complications: Secondary | ICD-10-CM | POA: Diagnosis not present

## 2018-05-07 DIAGNOSIS — Z125 Encounter for screening for malignant neoplasm of prostate: Secondary | ICD-10-CM | POA: Diagnosis not present

## 2018-05-07 DIAGNOSIS — E538 Deficiency of other specified B group vitamins: Secondary | ICD-10-CM | POA: Diagnosis not present

## 2018-05-14 DIAGNOSIS — E039 Hypothyroidism, unspecified: Secondary | ICD-10-CM | POA: Diagnosis not present

## 2018-05-14 DIAGNOSIS — Z0000 Encounter for general adult medical examination without abnormal findings: Secondary | ICD-10-CM | POA: Diagnosis not present

## 2018-05-14 DIAGNOSIS — E1151 Type 2 diabetes mellitus with diabetic peripheral angiopathy without gangrene: Secondary | ICD-10-CM | POA: Diagnosis not present

## 2018-05-14 DIAGNOSIS — E538 Deficiency of other specified B group vitamins: Secondary | ICD-10-CM | POA: Diagnosis not present

## 2018-05-14 DIAGNOSIS — E782 Mixed hyperlipidemia: Secondary | ICD-10-CM | POA: Diagnosis not present

## 2018-07-16 DIAGNOSIS — E538 Deficiency of other specified B group vitamins: Secondary | ICD-10-CM | POA: Diagnosis not present

## 2018-08-03 DIAGNOSIS — L400 Psoriasis vulgaris: Secondary | ICD-10-CM | POA: Diagnosis not present

## 2018-08-04 DIAGNOSIS — I10 Essential (primary) hypertension: Secondary | ICD-10-CM | POA: Diagnosis not present

## 2018-08-04 DIAGNOSIS — E782 Mixed hyperlipidemia: Secondary | ICD-10-CM | POA: Diagnosis not present

## 2018-08-04 DIAGNOSIS — I2510 Atherosclerotic heart disease of native coronary artery without angina pectoris: Secondary | ICD-10-CM | POA: Diagnosis not present

## 2018-08-04 DIAGNOSIS — E1151 Type 2 diabetes mellitus with diabetic peripheral angiopathy without gangrene: Secondary | ICD-10-CM | POA: Diagnosis not present

## 2018-11-05 DIAGNOSIS — E538 Deficiency of other specified B group vitamins: Secondary | ICD-10-CM | POA: Diagnosis not present

## 2018-11-05 DIAGNOSIS — E1151 Type 2 diabetes mellitus with diabetic peripheral angiopathy without gangrene: Secondary | ICD-10-CM | POA: Diagnosis not present

## 2018-11-05 DIAGNOSIS — E782 Mixed hyperlipidemia: Secondary | ICD-10-CM | POA: Diagnosis not present

## 2018-11-05 DIAGNOSIS — E039 Hypothyroidism, unspecified: Secondary | ICD-10-CM | POA: Diagnosis not present

## 2018-11-12 DIAGNOSIS — M791 Myalgia, unspecified site: Secondary | ICD-10-CM | POA: Diagnosis not present

## 2018-11-12 DIAGNOSIS — E538 Deficiency of other specified B group vitamins: Secondary | ICD-10-CM | POA: Diagnosis not present

## 2018-11-12 DIAGNOSIS — C61 Malignant neoplasm of prostate: Secondary | ICD-10-CM | POA: Diagnosis not present

## 2018-11-12 DIAGNOSIS — E1151 Type 2 diabetes mellitus with diabetic peripheral angiopathy without gangrene: Secondary | ICD-10-CM | POA: Diagnosis not present

## 2018-11-12 DIAGNOSIS — Z125 Encounter for screening for malignant neoplasm of prostate: Secondary | ICD-10-CM | POA: Diagnosis not present

## 2018-11-12 DIAGNOSIS — Z0000 Encounter for general adult medical examination without abnormal findings: Secondary | ICD-10-CM | POA: Diagnosis not present

## 2019-01-07 DIAGNOSIS — J4540 Moderate persistent asthma, uncomplicated: Secondary | ICD-10-CM | POA: Diagnosis not present

## 2019-01-07 DIAGNOSIS — R509 Fever, unspecified: Secondary | ICD-10-CM | POA: Diagnosis not present

## 2019-01-07 DIAGNOSIS — E1151 Type 2 diabetes mellitus with diabetic peripheral angiopathy without gangrene: Secondary | ICD-10-CM | POA: Diagnosis not present

## 2019-01-07 DIAGNOSIS — L409 Psoriasis, unspecified: Secondary | ICD-10-CM | POA: Diagnosis not present

## 2019-01-19 ENCOUNTER — Other Ambulatory Visit: Payer: Self-pay

## 2019-01-19 ENCOUNTER — Ambulatory Visit
Admission: RE | Admit: 2019-01-19 | Discharge: 2019-01-19 | Disposition: A | Discharge status: Home / Self Care | Payer: Medicare HMO | Source: Ambulatory Visit | Attending: Internal Medicine | Admitting: Internal Medicine

## 2019-01-19 ENCOUNTER — Other Ambulatory Visit: Payer: Self-pay | Admitting: Internal Medicine

## 2019-01-19 ENCOUNTER — Ambulatory Visit: Admission: RE | Admit: 2019-01-19 | Payer: Medicare HMO | Source: Ambulatory Visit

## 2019-01-19 DIAGNOSIS — R0600 Dyspnea, unspecified: Secondary | ICD-10-CM | POA: Diagnosis not present

## 2019-01-19 DIAGNOSIS — R0602 Shortness of breath: Secondary | ICD-10-CM

## 2019-01-19 DIAGNOSIS — R634 Abnormal weight loss: Secondary | ICD-10-CM | POA: Diagnosis not present

## 2019-01-19 DIAGNOSIS — E1151 Type 2 diabetes mellitus with diabetic peripheral angiopathy without gangrene: Secondary | ICD-10-CM | POA: Diagnosis not present

## 2019-01-19 LAB — POCT I-STAT CREATININE: Creatinine, Ser: 1.7 mg/dL — ABNORMAL HIGH (ref 0.61–1.24)

## 2019-01-19 MED ORDER — IOHEXOL 350 MG/ML SOLN
75.0000 mL | Freq: Once | INTRAVENOUS | Status: AC | PRN
  Administered 2019-01-19: 60 mL via INTRAVENOUS

## 2019-01-28 DIAGNOSIS — I2510 Atherosclerotic heart disease of native coronary artery without angina pectoris: Secondary | ICD-10-CM | POA: Diagnosis not present

## 2019-01-28 DIAGNOSIS — E782 Mixed hyperlipidemia: Secondary | ICD-10-CM | POA: Diagnosis not present

## 2019-01-28 DIAGNOSIS — E1151 Type 2 diabetes mellitus with diabetic peripheral angiopathy without gangrene: Secondary | ICD-10-CM | POA: Diagnosis not present

## 2019-01-28 DIAGNOSIS — I10 Essential (primary) hypertension: Secondary | ICD-10-CM | POA: Diagnosis not present

## 2019-02-01 DIAGNOSIS — Z79899 Other long term (current) drug therapy: Secondary | ICD-10-CM | POA: Diagnosis not present

## 2019-02-01 DIAGNOSIS — L400 Psoriasis vulgaris: Secondary | ICD-10-CM | POA: Diagnosis not present

## 2019-05-11 IMAGING — CT CT ANGIOGRAPHY CHEST
2 of 7 series · 13 of 36 positions shown · IV contrast (omnipaque)
Comparison: CT chest 01/31/2015.

CLINICAL DATA: Increasing shortness of breath over the past 2-3
days.

EXAM:
CT ANGIOGRAPHY CHEST WITH CONTRAST
TECHNIQUE: Multidetector CT imaging of the chest was performed using the
standard protocol during bolus administration of intravenous
contrast. Multiplanar CT image reconstructions and MIPs were
obtained to evaluate the vascular anatomy.
CONTRAST:  60 mL OMNIPAQUE IOHEXOL 350 MG/ML SOLN

[Series 6: thins cta pulmonary · axial · 0.68mm/px · z∈[-1205,-943]mm · 12 of 310 slices shown]
[im 24/310  lung]
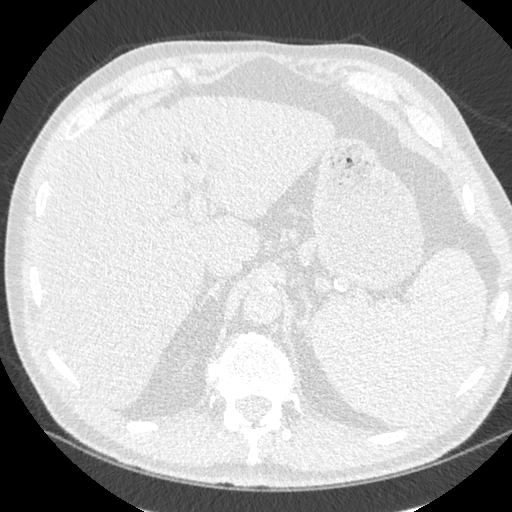
[im 48/310  mediastinal]
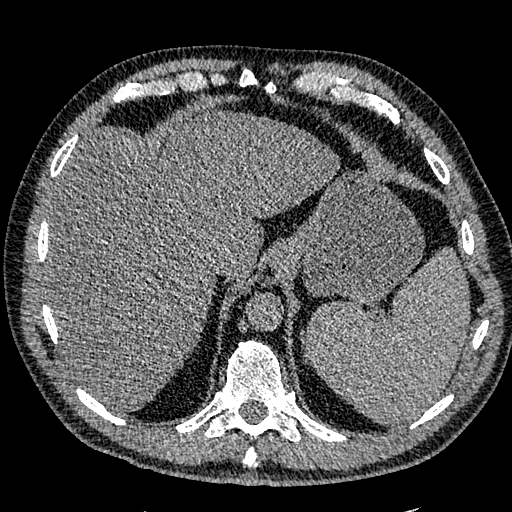
[im 72/310  lung]
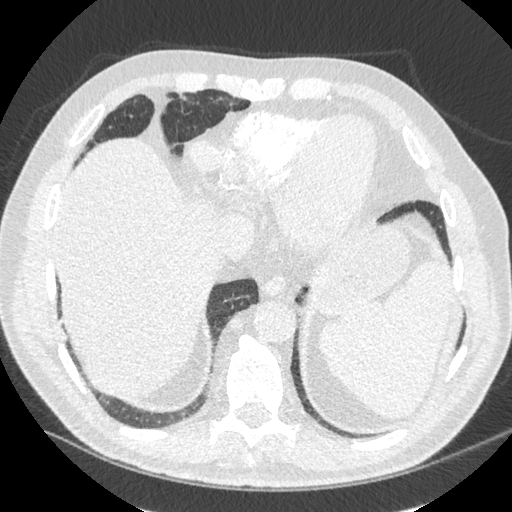
[im 96/310  mediastinal]
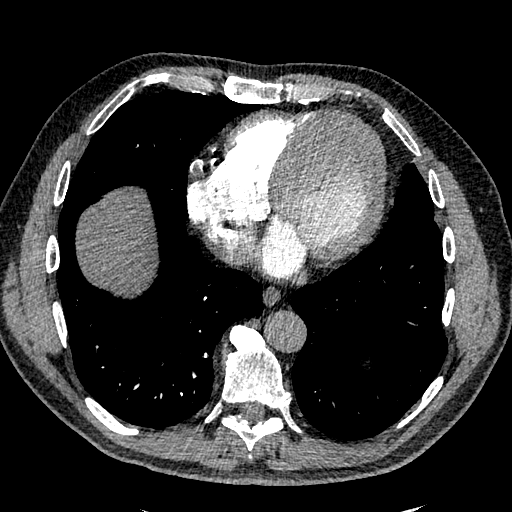
[im 119/310  lung]
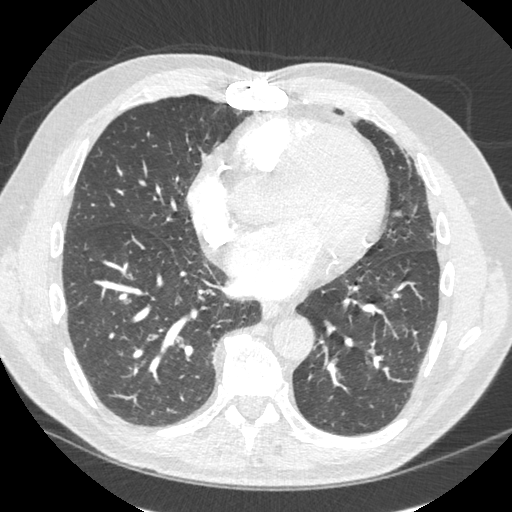
[im 143/310  mediastinal]
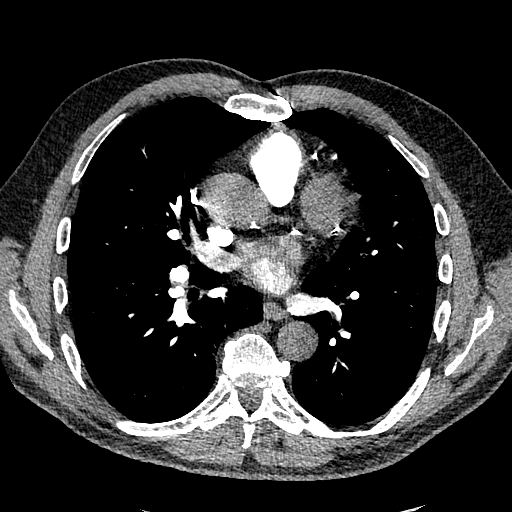
[im 167/310  lung]
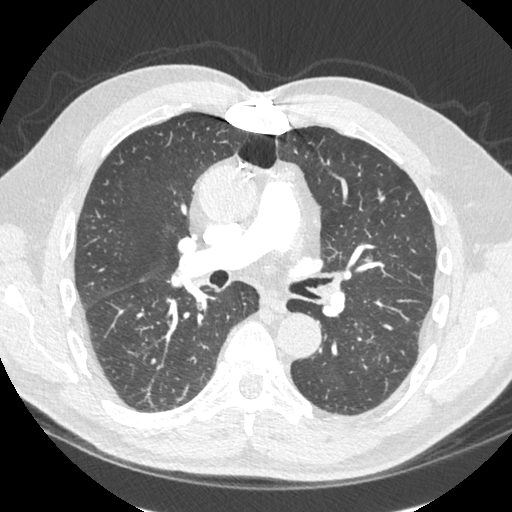
[im 191/310  mediastinal]
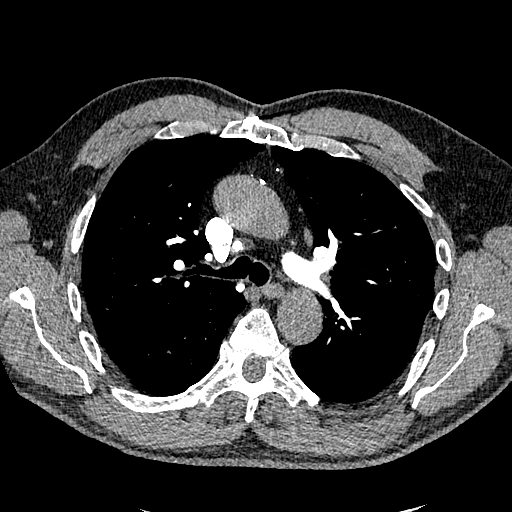
[im 214/310  lung]
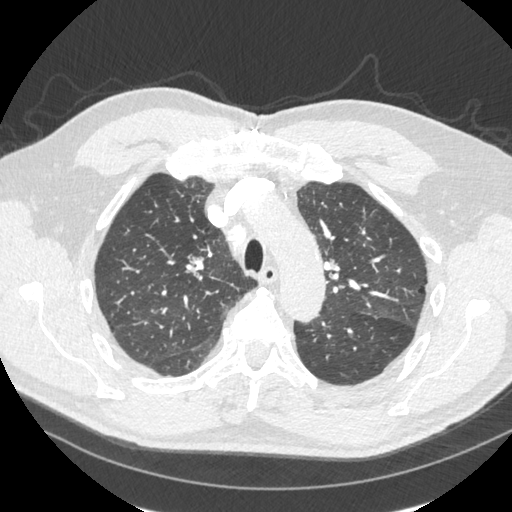
[im 238/310  mediastinal]
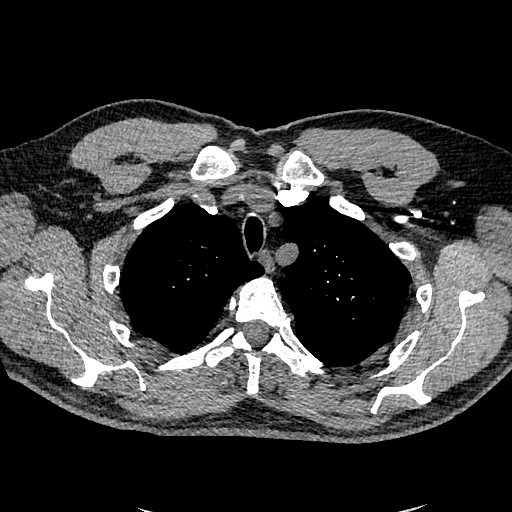
[im 262/310  lung]
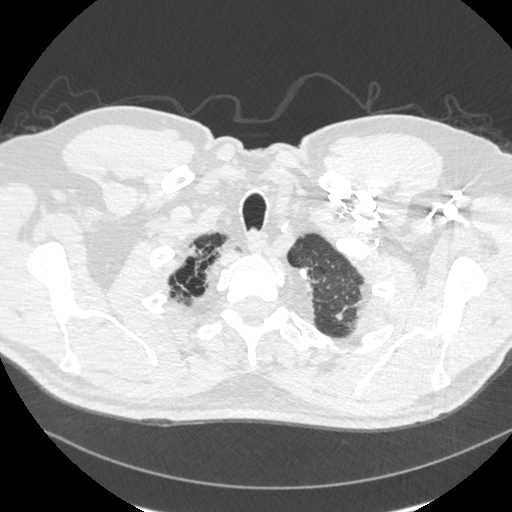
[im 286/310  mediastinal]
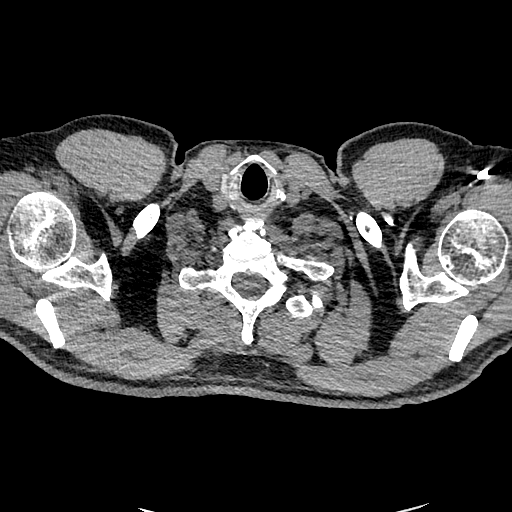

[Series 8: cta pulmonary · coronal · 0.61mm/px · 1 of 175 slices shown]
[im 88/175  mediastinal]
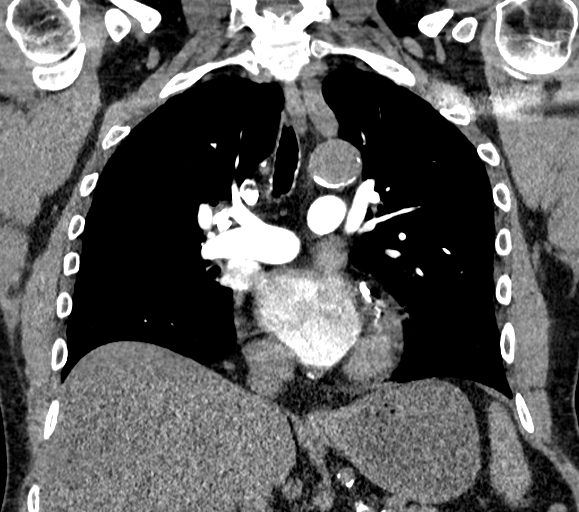

[13 of 36 positions shown; findings below may reference images not displayed]

FINDINGS: Cardiovascular: Satisfactory opacification of the pulmonary arteries
to the segmental level. No evidence of pulmonary embolism. Normal
heart size. No pericardial effusion. Calcific aortic and coronary
atherosclerosis is noted. The patient is status post CABG.

Mediastinum/Nodes: No enlarged mediastinal, hilar, or axillary lymph
nodes. Thyroid gland, trachea, and esophagus demonstrate no
significant findings. Calcified mediastinal and hilar lymph nodes
are seen.

Lungs/Pleura: Emphysematous change is seen in the apices. Calcified
granuloma right upper lobe is noted. No consolidative process. No
pleural effusion.

Upper Abdomen: There is fatty infiltration of the liver. No acute
finding.

Musculoskeletal: No lytic or sclerotic lesion. No acute abnormality.

Review of the MIP images confirms the above findings.
IMPRESSION: Negative for pulmonary embolus or acute disease.

Fatty infiltration of the liver.

Old granulomatous disease.

Aortic Atherosclerosis (G5PAI-ZIJ.J) and Emphysema (G5PAI-[id number].Z).

## 2019-06-18 DIAGNOSIS — M791 Myalgia, unspecified site: Secondary | ICD-10-CM | POA: Diagnosis not present

## 2019-06-18 DIAGNOSIS — B349 Viral infection, unspecified: Secondary | ICD-10-CM | POA: Diagnosis not present

## 2019-07-06 DIAGNOSIS — E538 Deficiency of other specified B group vitamins: Secondary | ICD-10-CM | POA: Diagnosis not present

## 2019-07-06 DIAGNOSIS — E1151 Type 2 diabetes mellitus with diabetic peripheral angiopathy without gangrene: Secondary | ICD-10-CM | POA: Diagnosis not present

## 2019-07-06 DIAGNOSIS — Z125 Encounter for screening for malignant neoplasm of prostate: Secondary | ICD-10-CM | POA: Diagnosis not present

## 2019-07-06 DIAGNOSIS — R5383 Other fatigue: Secondary | ICD-10-CM | POA: Diagnosis not present

## 2019-07-06 DIAGNOSIS — I10 Essential (primary) hypertension: Secondary | ICD-10-CM | POA: Diagnosis not present

## 2019-07-06 DIAGNOSIS — B349 Viral infection, unspecified: Secondary | ICD-10-CM | POA: Diagnosis not present

## 2019-07-06 DIAGNOSIS — R0981 Nasal congestion: Secondary | ICD-10-CM | POA: Diagnosis not present

## 2019-07-06 DIAGNOSIS — E785 Hyperlipidemia, unspecified: Secondary | ICD-10-CM | POA: Diagnosis not present

## 2019-07-06 DIAGNOSIS — Z0000 Encounter for general adult medical examination without abnormal findings: Secondary | ICD-10-CM | POA: Diagnosis not present

## 2019-09-20 DIAGNOSIS — Z87891 Personal history of nicotine dependence: Secondary | ICD-10-CM | POA: Diagnosis not present

## 2019-09-20 DIAGNOSIS — C61 Malignant neoplasm of prostate: Secondary | ICD-10-CM | POA: Diagnosis not present

## 2019-09-20 DIAGNOSIS — F0390 Unspecified dementia without behavioral disturbance: Secondary | ICD-10-CM | POA: Diagnosis not present

## 2019-09-20 DIAGNOSIS — M5416 Radiculopathy, lumbar region: Secondary | ICD-10-CM | POA: Diagnosis not present

## 2019-09-20 DIAGNOSIS — L4050 Arthropathic psoriasis, unspecified: Secondary | ICD-10-CM | POA: Diagnosis not present

## 2019-09-20 DIAGNOSIS — E119 Type 2 diabetes mellitus without complications: Secondary | ICD-10-CM | POA: Diagnosis not present

## 2020-03-14 ENCOUNTER — Other Ambulatory Visit: Payer: Self-pay

## 2020-03-14 ENCOUNTER — Ambulatory Visit: Payer: Medicare PPO | Admitting: Dermatology

## 2020-03-14 ENCOUNTER — Encounter: Payer: Self-pay | Admitting: Dermatology

## 2020-03-14 DIAGNOSIS — L4050 Arthropathic psoriasis, unspecified: Secondary | ICD-10-CM | POA: Diagnosis not present

## 2020-03-14 DIAGNOSIS — L409 Psoriasis, unspecified: Secondary | ICD-10-CM | POA: Diagnosis not present

## 2020-03-14 MED ORDER — BETAMETHASONE DIPROPIONATE AUG 0.05 % EX CREA: TOPICAL_CREAM | Freq: Two times a day (BID) | CUTANEOUS | 2 refills | Status: DC

## 2020-03-14 NOTE — Patient Instructions (Signed)
Recommend daily broad spectrum sunscreen SPF 30+ to sun-exposed areas, reapply every 2 hours as needed. Call for new or changing lesions.  

## 2020-03-14 NOTE — Progress Notes (Signed)
   Follow-Up Visit   Subjective  Antonio Barnes. is a 74 y.o. male who presents for the following: Follow-up.  Patient presents today for Psoriasis and joint pain, patient has take Humira in past. While taking Humira joint pain was improved while taking but returned when stopped. Has been on prednisone, Clobetasol and other prescription topicals. Patient has recently received steroid injections for joint pain.  He lives in [place] but comes back to [facility] for his derm visits.  The following portions of the chart were reviewed this encounter and updated as appropriate:      Review of Systems:  No other skin or systemic complaints except as noted in HPI or Assessment and Plan.  Objective  Well appearing patient in no apparent distress; mood and affect are within normal limits.  A focused examination was performed including Behind ears, scalp, legs and elbows. Relevant physical exam findings are noted in the Assessment and Plan.  Objective  B/L arms, b/l post auricular, frontal scalp, right shoulder, and right lower leg: Pink scaly patches and plaques arms, post auricular ears, frontal scalp, and right shoulder Pink scaly papules right lower leg BSA 6%   Assessment & Plan  Psoriasis B/L arms, b/l post auricular, frontal scalp, right shoulder, and right lower leg  With Psoriatic arthritis Plan to start Cosentyx, pending labs  Start  Augmented betamethasone dipropionate 0.05% cream, apple to affected areas twice daily as needed. Avoid applying to face, groin, and axilla. Use as directed. Risk of skin atrophy with long-term use reviewed.   Start Halog solution to affected areas scalp behind ears qd/bid as needed. Sample given  Discussed Cosentyx, will send in Rx to [facility] pending labs  Other Related Procedures Comprehensive metabolic panel CBC with Differential/Platelet QuantiFERON-TB Gold Plus Lipid Panel  Ordered Medications: augmented betamethasone dipropionate  (DIPROLENE-AF) 0.05 % cream  Return in about 6 weeks (around 04/25/2020).  Marene Lenz, CMA, am acting as scribe for Brendolyn Patty, MD .  Documentation: I have reviewed the above documentation for accuracy and completeness, and I agree with the above.  Brendolyn Patty MD

## 2020-03-15 ENCOUNTER — Telehealth: Payer: Self-pay

## 2020-03-15 NOTE — Telephone Encounter (Signed)
Called [facility] to add a lipid panel to his lab orders from yesterday. Lab has been added with no problem to current sample given yesterday.

## 2020-03-16 LAB — QUANTIFERON-TB GOLD PLUS
QuantiFERON Mitogen Value: >10 IU/mL
QuantiFERON Nil Value: 0.42 IU/mL
QuantiFERON TB1 Ag Value: 0.42 IU/mL
QuantiFERON TB2 Ag Value: 0.42 IU/mL
QuantiFERON-TB Gold Plus: NEGATIVE

## 2020-03-16 LAB — COMPREHENSIVE METABOLIC PANEL
ALT: 16 IU/L (ref 0–44)
AST: 18 IU/L (ref 0–40)
Albumin/Globulin Ratio: 1.3 (ref 1.2–2.2)
Albumin: 3.9 g/dL (ref 3.7–4.7)
Alkaline Phosphatase: 66 IU/L (ref 48–121)
BUN/Creatinine Ratio: 14 (ref 10–24)
BUN: 17 mg/dL (ref 8–27)
Bilirubin Total: 0.9 mg/dL (ref 0.0–1.2)
CO2: 25 mmol/L (ref 20–29)
Calcium: 9.5 mg/dL (ref 8.6–10.2)
Chloride: 101 mmol/L (ref 96–106)
Creatinine, Ser: 1.23 mg/dL (ref 0.76–1.27)
GFR calc Af Amer: 68 mL/min/{1.73_m2} (ref >59)
GFR calc non Af Amer: 59 mL/min/{1.73_m2} — ABNORMAL LOW (ref >59)
Globulin, Total: 3.1 g/dL (ref 1.5–4.5)
Glucose: 124 mg/dL — ABNORMAL HIGH (ref 65–99)
Potassium: 3.9 mmol/L (ref 3.5–5.2)
Sodium: 138 mmol/L (ref 134–144)
Total Protein: 7 g/dL (ref 6.0–8.5)

## 2020-03-16 LAB — CBC WITH DIFFERENTIAL/PLATELET
Basophils Absolute: 0.1 10*3/uL (ref 0.0–0.2)
Basos: 1 %
EOS (ABSOLUTE): 0.1 10*3/uL (ref 0.0–0.4)
Eos: 1 %
Hematocrit: 38.7 % (ref 37.5–51.0)
Hemoglobin: 13.7 g/dL (ref 13.0–17.7)
Immature Grans (Abs): 0.2 10*3/uL — ABNORMAL HIGH (ref 0.0–0.1)
Immature Granulocytes: 2 %
Lymphocytes Absolute: 1.7 10*3/uL (ref 0.7–3.1)
Lymphs: 24 %
MCH: 30 pg (ref 26.6–33.0)
MCHC: 35.4 g/dL (ref 31.5–35.7)
MCV: 85 fL (ref 79–97)
Monocytes Absolute: 0.9 10*3/uL (ref 0.1–0.9)
Monocytes: 12 %
Neutrophils Absolute: 4.4 10*3/uL (ref 1.4–7.0)
Neutrophils: 60 %
Platelets: 224 10*3/uL (ref 150–450)
RBC: 4.56 x10E6/uL (ref 4.14–5.80)
RDW: 16.4 % — ABNORMAL HIGH (ref 11.6–15.4)
WBC: 7.3 10*3/uL (ref 3.4–10.8)

## 2020-03-20 ENCOUNTER — Telehealth: Payer: Self-pay

## 2020-03-20 ENCOUNTER — Other Ambulatory Visit: Payer: Self-pay

## 2020-03-20 MED ORDER — COSENTYX SENSOREADY (300 MG) 150 MG/ML ~~LOC~~ SOAJ: 300.0000 mg | SUBCUTANEOUS | 0 refills | Status: DC

## 2020-03-20 MED ORDER — COSENTYX SENSOREADY (300 MG) 150 MG/ML ~~LOC~~ SOAJ: 300.0000 mg | SUBCUTANEOUS | 3 refills | Status: DC

## 2020-03-20 NOTE — Telephone Encounter (Signed)
LVM for patient to return call. 

## 2020-03-20 NOTE — Progress Notes (Signed)
After labs were reviewed, Loading doses and maintenance  doses were sent in to [facility]

## 2020-03-21 ENCOUNTER — Telehealth: Payer: Self-pay

## 2020-03-21 NOTE — Telephone Encounter (Signed)
Patient called and informed oflabresults, patient verbalized understanding. Patient was also informed that Cosentyx was sent in to [facility].

## 2020-04-06 LAB — LIPID PANEL W/O CHOL/HDL RATIO
Cholesterol, Total: 147 mg/dL (ref 100–199)
HDL: 38 mg/dL — ABNORMAL LOW (ref >39)
LDL Chol Calc (NIH): 68 mg/dL (ref 0–99)
Triglycerides: 255 mg/dL — ABNORMAL HIGH (ref 0–149)
VLDL Cholesterol Cal: 41 mg/dL — ABNORMAL HIGH (ref 5–40)

## 2020-04-06 LAB — SPECIMEN STATUS REPORT

## 2020-04-25 ENCOUNTER — Ambulatory Visit: Payer: Medicare PPO | Admitting: Dermatology

## 2020-05-01 ENCOUNTER — Ambulatory Visit: Payer: Medicare PPO | Admitting: Dermatology

## 2020-05-01 ENCOUNTER — Other Ambulatory Visit: Payer: Self-pay

## 2020-05-01 DIAGNOSIS — D692 Other nonthrombocytopenic purpura: Secondary | ICD-10-CM | POA: Diagnosis not present

## 2020-05-01 DIAGNOSIS — Z79899 Other long term (current) drug therapy: Secondary | ICD-10-CM

## 2020-05-01 DIAGNOSIS — L4050 Arthropathic psoriasis, unspecified: Secondary | ICD-10-CM

## 2020-05-01 DIAGNOSIS — L409 Psoriasis, unspecified: Secondary | ICD-10-CM | POA: Diagnosis not present

## 2020-05-01 NOTE — Progress Notes (Signed)
   Follow-Up Visit   Subjective  Antonio Barnes. is a 74 y.o. male who presents for the following: Psoriasis with Psoriatic Arthritis (arms, legs, R shoulder, scalp). Patient is only using betamethasone dipropionate cream to areas of psoriasis. He is flaring more and joint pain has worsened. Cosentyx was too expensive through insurance- $3000 copay. Patient has not filled out for patient assistance yet.  He has has three steroid shots this year for joint pain.    The following portions of the chart were reviewed this encounter and updated as appropriate:      Review of Systems:  No other skin or systemic complaints except as noted in HPI or Assessment and Plan.  Objective  Well appearing patient in no apparent distress; mood and affect are within normal limits.  A focused examination was performed including arms, legs, trunk, scalp. Relevant physical exam findings are noted in the Assessment and Plan.  Objective  Arms, Legs, Scalp, Trunk: Pink scaly plaques on forearms, legs, right upper thigh. Elbows, index fingers, knees, neck with joint pain.   Assessment & Plan   Purpura - Violaceous macules and patches - Benign - Related to age, sun damage and/or use of blood thinners, also frequent steroid injections - Observe - Can use OTC arnica containing moisturizer such as Dermend Bruise Formula if desired - Call for worsening or other concerns   Psoriasis Arms, Legs, Scalp, Trunk  With psoriatic arthritis  Continue betamethasone dipropionate cream QD/BID prn. Avoid face, groin, axilla.  Patient will fill out Patient Assistance for Cosentyx.  Samples x 4 of Cosentyx 150mg /mL given to patient today. Patient self injected Cosentyx 150mg /mL x 2 in bil thighs in-office today. Patient tolerated well. He is to inject remaining 2 samples in 1 week while he awaits patient assistance decision.  Topical steroids (such as triamcinolone, fluocinolone, fluocinonide, mometasone,  clobetasol, halobetasol, betamethasone, hydrocortisone) can cause thinning and lightening of the skin if they are used for too long in the same area. Your physician has selected the right strength medicine for your problem and area affected on the body. Please use your medication only as directed by your physician to prevent side effects.     Other Related Medications augmented betamethasone dipropionate (DIPROLENE-AF) 0.05 % cream Long term medication management. Return in about 3 months (around 08/01/2020) for psoriasis.   IJamesetta Orleans, CMA, am acting as scribe for Brendolyn Patty, MD .  Documentation: I have reviewed the above documentation for accuracy and completeness, and I agree with the above.  Brendolyn Patty MD

## 2020-07-25 ENCOUNTER — Ambulatory Visit: Payer: Medicare PPO | Admitting: Dermatology

## 2022-03-11 ENCOUNTER — Inpatient Hospital Stay
Admission: EM | Admit: 2022-03-11 | Discharge: 2022-03-13 | DRG: 683 | Disposition: A | Discharge status: Other Acute Inpatient Hospital | Payer: Medicare PPO | Attending: Internal Medicine | Admitting: Internal Medicine

## 2022-03-11 ENCOUNTER — Other Ambulatory Visit: Payer: Self-pay

## 2022-03-11 ENCOUNTER — Emergency Department: Payer: Medicare PPO

## 2022-03-11 ENCOUNTER — Encounter: Payer: Self-pay | Admitting: Emergency Medicine

## 2022-03-11 DIAGNOSIS — E039 Hypothyroidism, unspecified: Secondary | ICD-10-CM | POA: Diagnosis present

## 2022-03-11 DIAGNOSIS — D849 Immunodeficiency, unspecified: Secondary | ICD-10-CM | POA: Diagnosis present

## 2022-03-11 DIAGNOSIS — Z9049 Acquired absence of other specified parts of digestive tract: Secondary | ICD-10-CM

## 2022-03-11 DIAGNOSIS — Z955 Presence of coronary angioplasty implant and graft: Secondary | ICD-10-CM

## 2022-03-11 DIAGNOSIS — I10 Essential (primary) hypertension: Secondary | ICD-10-CM | POA: Diagnosis present

## 2022-03-11 DIAGNOSIS — K1230 Oral mucositis (ulcerative), unspecified: Secondary | ICD-10-CM | POA: Diagnosis present

## 2022-03-11 DIAGNOSIS — J45909 Unspecified asthma, uncomplicated: Secondary | ICD-10-CM | POA: Diagnosis present

## 2022-03-11 DIAGNOSIS — Z7902 Long term (current) use of antithrombotics/antiplatelets: Secondary | ICD-10-CM

## 2022-03-11 DIAGNOSIS — H109 Unspecified conjunctivitis: Secondary | ICD-10-CM | POA: Diagnosis present

## 2022-03-11 DIAGNOSIS — Z20822 Contact with and (suspected) exposure to covid-19: Secondary | ICD-10-CM | POA: Diagnosis present

## 2022-03-11 DIAGNOSIS — I2510 Atherosclerotic heart disease of native coronary artery without angina pectoris: Secondary | ICD-10-CM | POA: Diagnosis present

## 2022-03-11 DIAGNOSIS — Z79899 Other long term (current) drug therapy: Secondary | ICD-10-CM

## 2022-03-11 DIAGNOSIS — R001 Bradycardia, unspecified: Secondary | ICD-10-CM | POA: Diagnosis present

## 2022-03-11 DIAGNOSIS — I959 Hypotension, unspecified: Secondary | ICD-10-CM | POA: Diagnosis present

## 2022-03-11 DIAGNOSIS — B370 Candidal stomatitis: Secondary | ICD-10-CM

## 2022-03-11 DIAGNOSIS — N179 Acute kidney failure, unspecified: Secondary | ICD-10-CM | POA: Diagnosis not present

## 2022-03-11 DIAGNOSIS — E876 Hypokalemia: Secondary | ICD-10-CM | POA: Diagnosis not present

## 2022-03-11 DIAGNOSIS — I252 Old myocardial infarction: Secondary | ICD-10-CM

## 2022-03-11 DIAGNOSIS — J029 Acute pharyngitis, unspecified: Secondary | ICD-10-CM | POA: Diagnosis present

## 2022-03-11 DIAGNOSIS — C61 Malignant neoplasm of prostate: Secondary | ICD-10-CM | POA: Diagnosis present

## 2022-03-11 DIAGNOSIS — B3781 Candidal esophagitis: Secondary | ICD-10-CM | POA: Diagnosis present

## 2022-03-11 DIAGNOSIS — Z7982 Long term (current) use of aspirin: Secondary | ICD-10-CM

## 2022-03-11 DIAGNOSIS — Z951 Presence of aortocoronary bypass graft: Secondary | ICD-10-CM

## 2022-03-11 DIAGNOSIS — E860 Dehydration: Secondary | ICD-10-CM | POA: Diagnosis present

## 2022-03-11 DIAGNOSIS — Z8546 Personal history of malignant neoplasm of prostate: Secondary | ICD-10-CM

## 2022-03-11 DIAGNOSIS — Z79890 Hormone replacement therapy (postmenopausal): Secondary | ICD-10-CM

## 2022-03-11 DIAGNOSIS — Z87891 Personal history of nicotine dependence: Secondary | ICD-10-CM

## 2022-03-11 DIAGNOSIS — L409 Psoriasis, unspecified: Secondary | ICD-10-CM | POA: Diagnosis present

## 2022-03-11 DIAGNOSIS — L4050 Arthropathic psoriasis, unspecified: Secondary | ICD-10-CM | POA: Diagnosis present

## 2022-03-11 DIAGNOSIS — E1151 Type 2 diabetes mellitus with diabetic peripheral angiopathy without gangrene: Secondary | ICD-10-CM | POA: Diagnosis present

## 2022-03-11 DIAGNOSIS — R21 Rash and other nonspecific skin eruption: Secondary | ICD-10-CM | POA: Diagnosis present

## 2022-03-11 DIAGNOSIS — Z881 Allergy status to other antibiotic agents status: Secondary | ICD-10-CM

## 2022-03-11 HISTORY — DX: Hypothyroidism, unspecified: E03.9

## 2022-03-11 HISTORY — DX: Psoriasis, unspecified: L40.9

## 2022-03-11 HISTORY — DX: Ischemic cardiomyopathy: I25.5

## 2022-03-11 HISTORY — DX: Vitamin B12 deficiency anemia, unspecified: D51.9

## 2022-03-11 HISTORY — DX: Atherosclerotic heart disease of native coronary artery without angina pectoris: I25.10

## 2022-03-11 LAB — CBC WITH DIFFERENTIAL/PLATELET
Abs Immature Granulocytes: 0.13 10*3/uL — ABNORMAL HIGH (ref 0.00–0.07)
Basophils Absolute: 0 10*3/uL (ref 0.0–0.1)
Basophils Relative: 1 %
Eosinophils Absolute: 0.9 10*3/uL — ABNORMAL HIGH (ref 0.0–0.5)
Eosinophils Relative: 10 %
HCT: 39.5 % (ref 39.0–52.0)
Hemoglobin: 13.4 g/dL (ref 13.0–17.0)
Immature Granulocytes: 2 %
Lymphocytes Relative: 17 %
Lymphs Abs: 1.4 10*3/uL (ref 0.7–4.0)
MCH: 29.6 pg (ref 26.0–34.0)
MCHC: 33.9 g/dL (ref 30.0–36.0)
MCV: 87.4 fL (ref 80.0–100.0)
Monocytes Absolute: 0.8 10*3/uL (ref 0.1–1.0)
Monocytes Relative: 9 %
Neutro Abs: 5.3 10*3/uL (ref 1.7–7.7)
Neutrophils Relative %: 61 %
Platelets: 336 10*3/uL (ref 150–400)
RBC: 4.52 MIL/uL (ref 4.22–5.81)
RDW: 13.2 % (ref 11.5–15.5)
WBC: 8.5 10*3/uL (ref 4.0–10.5)
nRBC: 0 % (ref 0.0–0.2)

## 2022-03-11 LAB — COMPREHENSIVE METABOLIC PANEL
ALT: 17 U/L (ref 0–44)
AST: 16 U/L (ref 15–41)
Albumin: 3.5 g/dL (ref 3.5–5.0)
Alkaline Phosphatase: 48 U/L (ref 38–126)
Anion gap: 9 (ref 5–15)
BUN: 34 mg/dL — ABNORMAL HIGH (ref 8–23)
CO2: 25 mmol/L (ref 22–32)
Calcium: 9.6 mg/dL (ref 8.9–10.3)
Chloride: 102 mmol/L (ref 98–111)
Creatinine, Ser: 2.32 mg/dL — ABNORMAL HIGH (ref 0.61–1.24)
GFR, Estimated: 29 mL/min — ABNORMAL LOW (ref >60)
Glucose, Bld: 141 mg/dL — ABNORMAL HIGH (ref 70–99)
Potassium: 3.5 mmol/L (ref 3.5–5.1)
Sodium: 136 mmol/L (ref 135–145)
Total Bilirubin: 1.4 mg/dL — ABNORMAL HIGH (ref 0.3–1.2)
Total Protein: 7.7 g/dL (ref 6.5–8.1)

## 2022-03-11 LAB — URINALYSIS, ROUTINE W REFLEX MICROSCOPIC
Bilirubin Urine: NEGATIVE
Glucose, UA: NEGATIVE mg/dL
Ketones, ur: NEGATIVE mg/dL
Leukocytes,Ua: NEGATIVE
Nitrite: NEGATIVE
Protein, ur: 30 mg/dL — AB
Specific Gravity, Urine: 1.016 (ref 1.005–1.030)
pH: 5 (ref 5.0–8.0)

## 2022-03-11 LAB — PROTIME-INR
INR: 1.1 (ref 0.8–1.2)
Prothrombin Time: 14.5 seconds (ref 11.4–15.2)

## 2022-03-11 LAB — HIV ANTIBODY (ROUTINE TESTING W REFLEX): HIV Screen 4th Generation wRfx: NONREACTIVE

## 2022-03-11 LAB — MONONUCLEOSIS SCREEN: Mono Screen: NEGATIVE

## 2022-03-11 LAB — SARS CORONAVIRUS 2 BY RT PCR: SARS Coronavirus 2 by RT PCR: NEGATIVE

## 2022-03-11 LAB — CBG MONITORING, ED: Glucose-Capillary: 108 mg/dL — ABNORMAL HIGH (ref 70–99)

## 2022-03-11 LAB — LACTIC ACID, PLASMA
Lactic Acid, Venous: 1.3 mmol/L (ref 0.5–1.9)
Lactic Acid, Venous: 0.9 mmol/L (ref 0.5–1.9)

## 2022-03-11 LAB — GROUP A STREP BY PCR: Group A Strep by PCR: NOT DETECTED

## 2022-03-11 LAB — GLUCOSE, CAPILLARY: Glucose-Capillary: 125 mg/dL — ABNORMAL HIGH (ref 70–99)

## 2022-03-11 MED ORDER — CARVEDILOL 12.5 MG PO TABS
12.5000 mg | ORAL_TABLET | Freq: Two times a day (BID) | ORAL | Status: DC
  Administered 2022-03-11 – 2022-03-12 (×3): 12.5 mg via ORAL
  Filled 2022-03-11 (×2): qty 1
  Filled 2022-03-11: qty 2

## 2022-03-11 MED ORDER — ASPIRIN 81 MG PO TBEC
81.0000 mg | DELAYED_RELEASE_TABLET | Freq: Every day | ORAL | Status: DC
  Administered 2022-03-12: 81 mg via ORAL
  Filled 2022-03-11: qty 1

## 2022-03-11 MED ORDER — ALBUTEROL SULFATE (2.5 MG/3ML) 0.083% IN NEBU: 3.0000 mL | INHALATION_SOLUTION | Freq: Four times a day (QID) | RESPIRATORY_TRACT | Status: DC | PRN

## 2022-03-11 MED ORDER — SODIUM CHLORIDE 0.45 % IV SOLN: INTRAVENOUS | Status: DC

## 2022-03-11 MED ORDER — TRAZODONE HCL 50 MG PO TABS
25.0000 mg | ORAL_TABLET | Freq: Every evening | ORAL | Status: DC | PRN
  Administered 2022-03-11: 25 mg via ORAL
  Filled 2022-03-11: qty 1

## 2022-03-11 MED ORDER — MAGIC MOUTHWASH W/LIDOCAINE
5.0000 mL | Freq: Four times a day (QID) | ORAL | Status: DC
  Administered 2022-03-11 – 2022-03-13 (×10): 5 mL via ORAL
  Filled 2022-03-11 (×10): qty 5

## 2022-03-11 MED ORDER — ENOXAPARIN SODIUM 40 MG/0.4ML IJ SOSY
40.0000 mg | PREFILLED_SYRINGE | INTRAMUSCULAR | Status: DC
  Administered 2022-03-11 – 2022-03-13 (×3): 40 mg via SUBCUTANEOUS
  Filled 2022-03-11 (×3): qty 0.4

## 2022-03-11 MED ORDER — LEVOTHYROXINE SODIUM 50 MCG PO TABS
150.0000 ug | ORAL_TABLET | Freq: Every day | ORAL | Status: DC
  Administered 2022-03-12: 150 ug via ORAL
  Filled 2022-03-11: qty 3

## 2022-03-11 MED ORDER — ACETAMINOPHEN 650 MG RE SUPP
650.0000 mg | Freq: Four times a day (QID) | RECTAL | Status: DC | PRN
  Administered 2022-03-12: 650 mg via RECTAL
  Filled 2022-03-11: qty 1

## 2022-03-11 MED ORDER — EZETIMIBE 10 MG PO TABS
10.0000 mg | ORAL_TABLET | Freq: Every day | ORAL | Status: DC
  Administered 2022-03-12: 10 mg via ORAL
  Filled 2022-03-11: qty 1

## 2022-03-11 MED ORDER — FLUCONAZOLE IN SODIUM CHLORIDE 400-0.9 MG/200ML-% IV SOLN
400.0000 mg | Freq: Once | INTRAVENOUS | Status: AC
  Administered 2022-03-11: 400 mg via INTRAVENOUS
  Filled 2022-03-11: qty 200

## 2022-03-11 MED ORDER — SODIUM CHLORIDE 0.9 % IV BOLUS
1000.0000 mL | Freq: Once | INTRAVENOUS | Status: AC
  Administered 2022-03-11: 1000 mL via INTRAVENOUS

## 2022-03-11 MED ORDER — AMLODIPINE BESYLATE 5 MG PO TABS
5.0000 mg | ORAL_TABLET | Freq: Every day | ORAL | Status: DC
Start: 2022-03-12 — End: 2022-03-12
  Filled 2022-03-11: qty 1

## 2022-03-11 MED ORDER — TRIAMCINOLONE ACETONIDE 0.5 % EX CREA: TOPICAL_CREAM | Freq: Two times a day (BID) | CUTANEOUS | Status: DC

## 2022-03-11 MED ORDER — CLOPIDOGREL BISULFATE 75 MG PO TABS
75.0000 mg | ORAL_TABLET | Freq: Every day | ORAL | Status: DC
  Administered 2022-03-12: 75 mg via ORAL
  Filled 2022-03-11: qty 1

## 2022-03-11 MED ORDER — ACETAMINOPHEN 325 MG PO TABS
650.0000 mg | ORAL_TABLET | Freq: Four times a day (QID) | ORAL | Status: DC | PRN
  Administered 2022-03-12: 650 mg via ORAL
  Filled 2022-03-11 (×2): qty 2

## 2022-03-11 MED ORDER — NAPROXEN 500 MG PO TABS: 500.0000 mg | ORAL_TABLET | Freq: Every day | ORAL | Status: DC | PRN

## 2022-03-11 MED ORDER — INSULIN ASPART 100 UNIT/ML IJ SOLN
0.0000 [IU] | Freq: Three times a day (TID) | INTRAMUSCULAR | Status: DC
  Administered 2022-03-12 – 2022-03-13 (×4): 2 [IU] via SUBCUTANEOUS
  Filled 2022-03-11 (×4): qty 1

## 2022-03-11 MED ORDER — FLUCONAZOLE 100MG IVPB
50.0000 mg | INTRAVENOUS | Status: DC
  Filled 2022-03-11 (×2): qty 25

## 2022-03-11 MED ORDER — FAMOTIDINE 20 MG PO TABS
10.0000 mg | ORAL_TABLET | Freq: Two times a day (BID) | ORAL | Status: DC
  Administered 2022-03-11 – 2022-03-12 (×2): 10 mg via ORAL
  Filled 2022-03-11 (×2): qty 1

## 2022-03-11 MED ORDER — SULFACETAMIDE SODIUM 10 % OP SOLN
2.0000 [drp] | Freq: Four times a day (QID) | OPHTHALMIC | Status: DC
Start: 2022-03-11 — End: 2022-03-12
  Administered 2022-03-11 – 2022-03-12 (×4): 2 [drp] via OPHTHALMIC
  Filled 2022-03-11: qty 15

## 2022-03-11 NOTE — ED Triage Notes (Signed)
Pt to ED via [facility] for hypotension. Pt went in to see his PCP for follow up of sore throat. Per note from PCP pt now has a rash and was found to be hypotension. Pt hypotensive in triage at 79/51.

## 2022-03-11 NOTE — Assessment & Plan Note (Addendum)
Dehydration 2/2 thrush and poor PO intake  Plan IV hydration with 1/2NS 100 cc/hr  F/u Bmet in AM

## 2022-03-11 NOTE — Assessment & Plan Note (Signed)
Patient reports taking no meds. Unknown last A1C  Plan A1C  Ss  Low carb diet

## 2022-03-11 NOTE — ED Provider Notes (Signed)
[facility] Provider Note    Event Date/Time   First MD Initiated Contact with Patient 03/11/22 1020     (approximate)   History   Chief Complaint Hypotension   HPI  Antonio Barnes. is a 76 y.o. male with past medical history of hypertension, diabetes, asthma, and CAD who presents to the ED complaining of sore throat.  Patient reports that he has been dealing with about 2 weeks of sore throat and difficulty swallowing.  He was seen by his PCP for this problem 6 days ago and started on Omnicef, seen a couple of days later after symptoms did not improve and started on Diflucan for possible candidal esophagitis.  He states that his throat has continued to worsen since then and he has had significantly diminished oral intake with weight loss.  He has soreness inside his mouth and along his tongue in addition to his throat.  He denies any fevers, does endorse a cough but has not had any chest pain or shortness of breath.  He has not had any abdominal pain, nausea, vomiting, diarrhea, or dysuria.  He was seen at his PCPs office earlier today and noted to have low blood pressure, subsequently referred to the ED for further evaluation.  Patient also reports spread of red bumpy rash across much of his trunk.     Physical Exam   Triage Vital Signs: ED Triage Vitals  Enc Vitals Group     BP 03/11/22 0954 (!) 79/51     Pulse Rate 03/11/22 0954 (!) 58     Resp 03/11/22 0954 16     Temp 03/11/22 0954 98 F (36.7 C)     Temp Source 03/11/22 0954 Oral     SpO2 03/11/22 0954 98 %     Weight 03/11/22 0955 202 lb (91.6 kg)     Height 03/11/22 0955 6\' 2"  (1.88 m)     Head Circumference --      Peak Flow --      Pain Score 03/11/22 0955 9     Pain Loc --      Pain Edu? --      Excl. in GC? --     Most recent vital signs: Vitals:   03/11/22 0954 03/11/22 1030  BP: (!) 79/51 131/67  Pulse: (!) 58 (!) 56  Resp: 16 20  Temp: 98 F (36.7 C)   SpO2: 98% 99%     Constitutional: Alert and oriented. Eyes: Conjunctivae are normal. Head: Atraumatic. Nose: No congestion/rhinnorhea. Mouth/Throat: Mucous membranes are dry, whitish plaque noted over tongue and roof of mouth with tonsillar erythema and mild edema bilaterally. Neck: No anterior neck tenderness. Cardiovascular: Normal rate, regular rhythm. Grossly normal heart sounds.  2+ radial pulses bilaterally. Respiratory: Normal respiratory effort.  No retractions. Lungs CTAB. Gastrointestinal: Soft and nontender. No distention. Musculoskeletal: No lower extremity tenderness nor edema.  Fine maculopapular rash over majority of patient's trunk, no particular or purulence noted.  No skin sloughing noted. Neurologic:  Normal speech and language. No gross focal neurologic deficits are appreciated.    ED Results / Procedures / Treatments   Labs (all labs ordered are listed, but only abnormal results are displayed) Labs Reviewed  COMPREHENSIVE METABOLIC PANEL - Abnormal; Notable for the following components:      Result Value   Glucose, Bld 141 (*)    BUN 34 (*)    Creatinine, Ser 2.32 (*)    Total Bilirubin 1.4 (*)  GFR, Estimated 29 (*)    All other components within normal limits  CBC WITH DIFFERENTIAL/PLATELET - Abnormal; Notable for the following components:   Eosinophils Absolute 0.9 (*)    Abs Immature Granulocytes 0.13 (*)    All other components within normal limits  GROUP A STREP BY PCR  SARS CORONAVIRUS 2 BY RT PCR  CULTURE, BLOOD (ROUTINE X 2)  CULTURE, BLOOD (ROUTINE X 2)  LACTIC ACID, PLASMA  PROTIME-INR  LACTIC ACID, PLASMA  URINALYSIS, ROUTINE W REFLEX MICROSCOPIC  HIV ANTIBODY (ROUTINE TESTING W REFLEX)  MONONUCLEOSIS SCREEN     EKG  ED ECG REPORT I, Chesley Noon, the attending physician, personally viewed and interpreted this ECG.   Date: 03/11/2022  EKG Time: 9:58  Rate: 59  Rhythm: sinus bradycardia  Axis: LAD  Intervals:none  ST&T Change:  LVH  RADIOLOGY CT soft tissue neck reviewed and interpreted by me with no focal fluid collections or obvious inflammatory changes.  PROCEDURES:  Critical Care performed: No  Procedures   MEDICATIONS ORDERED IN ED: Medications  fluconazole (DIFLUCAN) IVPB 400 mg (has no administration in time range)  sodium chloride 0.9 % bolus 1,000 mL (0 mLs Intravenous Stopped 03/11/22 1106)     IMPRESSION / MDM / ASSESSMENT AND PLAN / ED COURSE  I reviewed the triage vital signs and the nursing notes.                              76 y.o. male with past medical history of hypertension, diabetes, CAD status post CABG, and asthma who presents to the ED complaining of increasing sore throat for almost 2 weeks now with significant difficulty swallowing and poor p.o. intake.  Patient's presentation is most consistent with acute presentation with potential threat to life or bodily function.  Differential diagnosis includes, but is not limited to, strep pharyngitis, viral pharyngitis, peritonsillar abscess, epiglottitis, retropharyngeal abscess, tracheitis, candidal esophagitis, dehydration, electrolyte abnormality, AKI, sepsis.  Patient nontoxic-appearing and in no acute distress, vital signs remarkable for initial hypotension, however this improved once patient brought back to a room prior to IV fluids.  I do suspect he is significantly dehydrated given his poor p.o. intake and we will give 1 L IV fluid bolus.  Initial labs concerning for AKI without significant associated electrolyte abnormality.  Remainder of labs are reassuring and do not appear consistent with sepsis at this time.  He does have significant whitish plaques over the tongue, roof of mouth, and posterior oropharynx.  Presentation concerning for candidal esophagitis but we will further assess with CT scan for evidence of abscess or other acute process.  CT of soft tissue neck is unremarkable, remainder of labs are reassuring with no  significant leukocytosis or anemia, strep and COVID testing is negative.  We will start patient on IV fluconazole given concern for esophageal candidiasis with difficulty swallowing at this time.  Case discussed with hospitalist for admission.      FINAL CLINICAL IMPRESSION(S) / ED DIAGNOSES   Final diagnoses:  Sore throat  AKI (acute kidney injury) (HCC)  Esophageal candidiasis (HCC)     Rx / DC Orders   ED Discharge Orders     None        Note:  This document was prepared using Dragon voice recognition software and may include unintentional dictation errors.   Chesley Noon, MD 03/11/22 [phone]

## 2022-03-11 NOTE — Subjective & Objective (Signed)
Mr. Biggio, a 76 y/o with multiple medical problems including DM2 -diet controlled, HTN, Hypothyroidism, HTN, psoriasis on immunologic therapy and immunocompromised has been follow for pharyngitis by his outpatient physician. He was treated with abx. He subsequently developed oral plague extending to posterior pharynx and was started on nystatis swish and swallow and oral fluconazole. His symptoms progressed to include swelling of the tongue with worsening plaque. He had decreased PO intake as a result with concomittant weight loss. He was seen in clinic on the day of admission and referred to [facility] for further evaluation and treatment.

## 2022-03-11 NOTE — ED Notes (Signed)
Receiving RN Sherrie George has agreed to accept [facility] once pt has arrived to inpatient unit, all questions and concerns address.

## 2022-03-11 NOTE — Assessment & Plan Note (Signed)
BP stable. Will continue home meds but hold ACE-I

## 2022-03-12 DIAGNOSIS — R21 Rash and other nonspecific skin eruption: Secondary | ICD-10-CM | POA: Diagnosis present

## 2022-03-12 DIAGNOSIS — Z8546 Personal history of malignant neoplasm of prostate: Secondary | ICD-10-CM | POA: Diagnosis not present

## 2022-03-12 DIAGNOSIS — Z955 Presence of coronary angioplasty implant and graft: Secondary | ICD-10-CM | POA: Diagnosis not present

## 2022-03-12 DIAGNOSIS — I959 Hypotension, unspecified: Secondary | ICD-10-CM | POA: Diagnosis present

## 2022-03-12 DIAGNOSIS — D849 Immunodeficiency, unspecified: Secondary | ICD-10-CM | POA: Diagnosis present

## 2022-03-12 DIAGNOSIS — I2510 Atherosclerotic heart disease of native coronary artery without angina pectoris: Secondary | ICD-10-CM | POA: Diagnosis present

## 2022-03-12 DIAGNOSIS — L511 Stevens-Johnson syndrome: Secondary | ICD-10-CM | POA: Diagnosis not present

## 2022-03-12 DIAGNOSIS — B370 Candidal stomatitis: Secondary | ICD-10-CM | POA: Diagnosis present

## 2022-03-12 DIAGNOSIS — Z9049 Acquired absence of other specified parts of digestive tract: Secondary | ICD-10-CM | POA: Diagnosis not present

## 2022-03-12 DIAGNOSIS — E1151 Type 2 diabetes mellitus with diabetic peripheral angiopathy without gangrene: Secondary | ICD-10-CM | POA: Diagnosis present

## 2022-03-12 DIAGNOSIS — I10 Essential (primary) hypertension: Secondary | ICD-10-CM | POA: Diagnosis present

## 2022-03-12 DIAGNOSIS — R001 Bradycardia, unspecified: Secondary | ICD-10-CM | POA: Diagnosis present

## 2022-03-12 DIAGNOSIS — L409 Psoriasis, unspecified: Secondary | ICD-10-CM | POA: Diagnosis not present

## 2022-03-12 DIAGNOSIS — Z881 Allergy status to other antibiotic agents status: Secondary | ICD-10-CM | POA: Diagnosis not present

## 2022-03-12 DIAGNOSIS — H109 Unspecified conjunctivitis: Secondary | ICD-10-CM | POA: Diagnosis present

## 2022-03-12 DIAGNOSIS — Z87891 Personal history of nicotine dependence: Secondary | ICD-10-CM | POA: Diagnosis not present

## 2022-03-12 DIAGNOSIS — K1230 Oral mucositis (ulcerative), unspecified: Secondary | ICD-10-CM | POA: Diagnosis present

## 2022-03-12 DIAGNOSIS — Z20822 Contact with and (suspected) exposure to covid-19: Secondary | ICD-10-CM | POA: Diagnosis present

## 2022-03-12 DIAGNOSIS — Z951 Presence of aortocoronary bypass graft: Secondary | ICD-10-CM | POA: Diagnosis not present

## 2022-03-12 DIAGNOSIS — L4050 Arthropathic psoriasis, unspecified: Secondary | ICD-10-CM | POA: Diagnosis present

## 2022-03-12 DIAGNOSIS — E860 Dehydration: Secondary | ICD-10-CM | POA: Diagnosis present

## 2022-03-12 DIAGNOSIS — J45909 Unspecified asthma, uncomplicated: Secondary | ICD-10-CM | POA: Diagnosis present

## 2022-03-12 DIAGNOSIS — J029 Acute pharyngitis, unspecified: Secondary | ICD-10-CM | POA: Diagnosis present

## 2022-03-12 DIAGNOSIS — B3781 Candidal esophagitis: Secondary | ICD-10-CM | POA: Diagnosis present

## 2022-03-12 DIAGNOSIS — N179 Acute kidney failure, unspecified: Secondary | ICD-10-CM | POA: Diagnosis present

## 2022-03-12 DIAGNOSIS — I252 Old myocardial infarction: Secondary | ICD-10-CM | POA: Diagnosis not present

## 2022-03-12 DIAGNOSIS — E039 Hypothyroidism, unspecified: Secondary | ICD-10-CM

## 2022-03-12 LAB — BASIC METABOLIC PANEL
Anion gap: 7 (ref 5–15)
BUN: 32 mg/dL — ABNORMAL HIGH (ref 8–23)
CO2: 21 mmol/L — ABNORMAL LOW (ref 22–32)
Calcium: 8.4 mg/dL — ABNORMAL LOW (ref 8.9–10.3)
Chloride: 109 mmol/L (ref 98–111)
Creatinine, Ser: 1.45 mg/dL — ABNORMAL HIGH (ref 0.61–1.24)
GFR, Estimated: 51 mL/min — ABNORMAL LOW (ref >60)
Glucose, Bld: 112 mg/dL — ABNORMAL HIGH (ref 70–99)
Potassium: 3.1 mmol/L — ABNORMAL LOW (ref 3.5–5.1)
Sodium: 137 mmol/L (ref 135–145)

## 2022-03-12 LAB — RESPIRATORY PANEL BY PCR

## 2022-03-12 LAB — GLUCOSE, CAPILLARY
Glucose-Capillary: 121 mg/dL — ABNORMAL HIGH (ref 70–99)
Glucose-Capillary: 121 mg/dL — ABNORMAL HIGH (ref 70–99)
Glucose-Capillary: 94 mg/dL (ref 70–99)
Glucose-Capillary: 126 mg/dL — ABNORMAL HIGH (ref 70–99)

## 2022-03-12 LAB — HEMOGLOBIN A1C
Hgb A1c MFr Bld: 5.7 % — ABNORMAL HIGH (ref 4.8–5.6)
Mean Plasma Glucose: 117 mg/dL

## 2022-03-12 LAB — MAGNESIUM: Magnesium: 1.8 mg/dL (ref 1.7–2.4)

## 2022-03-12 MED ORDER — SODIUM CHLORIDE 0.9 % IV SOLN: INTRAVENOUS | Status: DC

## 2022-03-12 MED ORDER — POLYMYXIN B-TRIMETHOPRIM 10000-0.1 UNIT/ML-% OP SOLN
2.0000 [drp] | OPHTHALMIC | Status: DC
Start: 2022-03-12 — End: 2022-03-14
  Administered 2022-03-12 – 2022-03-13 (×7): 2 [drp] via OPHTHALMIC
  Filled 2022-03-12: qty 10

## 2022-03-12 MED ORDER — HYDROCORTISONE 1 % EX CREA: TOPICAL_CREAM | Freq: Two times a day (BID) | CUTANEOUS | Status: DC | PRN

## 2022-03-12 MED ORDER — EPINEPHRINE 0.3 MG/0.3ML IJ SOAJ
0.3000 mg | Freq: Once | INTRAMUSCULAR | Status: AC
Start: 2022-03-12 — End: 2022-03-12
  Administered 2022-03-12: 0.3 mg via INTRAMUSCULAR
  Filled 2022-03-12: qty 0.3

## 2022-03-12 MED ORDER — ARTIFICIAL TEARS OPHTHALMIC OINT
TOPICAL_OINTMENT | Freq: Three times a day (TID) | OPHTHALMIC | Status: DC
  Filled 2022-03-12 (×2): qty 3.5

## 2022-03-12 MED ORDER — ADULT MULTIVITAMIN W/MINERALS CH
1.0000 | ORAL_TABLET | Freq: Every day | ORAL | Status: DC
  Administered 2022-03-12: 1 via ORAL
  Filled 2022-03-12: qty 1

## 2022-03-12 MED ORDER — POTASSIUM CHLORIDE 20 MEQ PO PACK
60.0000 meq | PACK | Freq: Once | ORAL | Status: AC
  Administered 2022-03-12: 60 meq via ORAL
  Filled 2022-03-12: qty 3

## 2022-03-12 MED ORDER — MAGNESIUM SULFATE IN D5W 1-5 GM/100ML-% IV SOLN
1.0000 g | Freq: Once | INTRAVENOUS | Status: AC
  Administered 2022-03-12: 1 g via INTRAVENOUS
  Filled 2022-03-12: qty 100

## 2022-03-12 MED ORDER — DIPHENHYDRAMINE HCL 25 MG PO CAPS
25.0000 mg | ORAL_CAPSULE | Freq: Three times a day (TID) | ORAL | Status: DC
  Administered 2022-03-12 – 2022-03-13 (×6): 25 mg via ORAL
  Filled 2022-03-12 (×6): qty 1

## 2022-03-12 MED ORDER — ENSURE ENLIVE PO LIQD
237.0000 mL | Freq: Three times a day (TID) | ORAL | Status: DC
  Administered 2022-03-12: 237 mL via ORAL

## 2022-03-12 MED ORDER — CYCLOSPORINE 25 MG PO CAPS
150.0000 mg | ORAL_CAPSULE | Freq: Two times a day (BID) | ORAL | Status: DC
  Administered 2022-03-12 – 2022-03-13 (×3): 150 mg via ORAL
  Filled 2022-03-12 (×4): qty 2

## 2022-03-12 NOTE — Significant Event (Addendum)
Discussed case with ID who is concerned for stevens johnson. Offending agent is unclear. At this time he is hemodynamically stable without significant dermal involvement and protecting his airway. Will start cyclosporine. Discussed case w/ dr. Brooke Dare of ophtho, will continue the antibiotic/steroid eye gtt that I have started as well as lubricating eye drops. If patient remains with [place] for more than another day he advised contacting [facility]ht[facility] for evaluation. Also discussed case w/ dr. Belia Heman of the icu. I have contacted [facility] and [facility] as they are the two burn centers in the state. [facility] is closed to transfers. [facility] would [facility] IF they have a biopsy proven case of SJS. Otherwise this would be a general medicine admission and they are closed to those admissions. I have contacted [facility] and they have accepted the patient. I will also hold his home medications as the offending agent is unclear. I contacted patient's daughter and updated her regarding the situation. I will transfer the patient to step-down for closer monitoring tonight.

## 2022-03-13 DIAGNOSIS — L511 Stevens-Johnson syndrome: Secondary | ICD-10-CM | POA: Diagnosis not present

## 2022-03-13 DIAGNOSIS — K1230 Oral mucositis (ulcerative), unspecified: Secondary | ICD-10-CM | POA: Diagnosis not present

## 2022-03-13 DIAGNOSIS — H109 Unspecified conjunctivitis: Secondary | ICD-10-CM | POA: Diagnosis not present

## 2022-03-13 DIAGNOSIS — R21 Rash and other nonspecific skin eruption: Secondary | ICD-10-CM

## 2022-03-13 LAB — BASIC METABOLIC PANEL
Anion gap: 9 (ref 5–15)
BUN: 36 mg/dL — ABNORMAL HIGH (ref 8–23)
CO2: 19 mmol/L — ABNORMAL LOW (ref 22–32)
Calcium: 8.4 mg/dL — ABNORMAL LOW (ref 8.9–10.3)
Chloride: 109 mmol/L (ref 98–111)
Creatinine, Ser: 1.86 mg/dL — ABNORMAL HIGH (ref 0.61–1.24)
GFR, Estimated: 38 mL/min — ABNORMAL LOW (ref >60)
Glucose, Bld: 123 mg/dL — ABNORMAL HIGH (ref 70–99)
Potassium: 3.2 mmol/L — ABNORMAL LOW (ref 3.5–5.1)
Sodium: 137 mmol/L (ref 135–145)

## 2022-03-13 LAB — GLUCOSE, CAPILLARY
Glucose-Capillary: 129 mg/dL — ABNORMAL HIGH (ref 70–99)
Glucose-Capillary: 123 mg/dL — ABNORMAL HIGH (ref 70–99)
Glucose-Capillary: 117 mg/dL — ABNORMAL HIGH (ref 70–99)
Glucose-Capillary: 142 mg/dL — ABNORMAL HIGH (ref 70–99)
Glucose-Capillary: 185 mg/dL — ABNORMAL HIGH (ref 70–99)
Glucose-Capillary: 209 mg/dL — ABNORMAL HIGH (ref 70–99)

## 2022-03-13 LAB — CBC
HCT: 32 % — ABNORMAL LOW (ref 39.0–52.0)
Hemoglobin: 11 g/dL — ABNORMAL LOW (ref 13.0–17.0)
MCH: 29.8 pg (ref 26.0–34.0)
MCHC: 34.4 g/dL (ref 30.0–36.0)
MCV: 86.7 fL (ref 80.0–100.0)
Platelets: 230 10*3/uL (ref 150–400)
RBC: 3.69 MIL/uL — ABNORMAL LOW (ref 4.22–5.81)
RDW: 13.2 % (ref 11.5–15.5)
WBC: 6.5 10*3/uL (ref 4.0–10.5)
nRBC: 0 % (ref 0.0–0.2)

## 2022-03-13 LAB — MAGNESIUM: Magnesium: 1.9 mg/dL (ref 1.7–2.4)

## 2022-03-13 LAB — PROCALCITONIN: Procalcitonin: <0.1 ng/mL

## 2022-03-13 LAB — RPR: RPR Ser Ql: NONREACTIVE

## 2022-03-13 MED ORDER — STERILE WATER FOR INJECTION IJ SOLN
INTRAMUSCULAR | Status: AC
  Administered 2022-03-13: 2 mL
  Filled 2022-03-13: qty 10

## 2022-03-13 MED ORDER — SODIUM CHLORIDE 0.9 % IV SOLN
500.0000 mg | INTRAVENOUS | Status: DC
  Administered 2022-03-13: 500 mg via INTRAVENOUS
  Filled 2022-03-13: qty 5

## 2022-03-13 MED ORDER — POTASSIUM CHLORIDE 10 MEQ/100ML IV SOLN
10.0000 meq | INTRAVENOUS | Status: AC
  Administered 2022-03-13 (×4): 10 meq via INTRAVENOUS
  Filled 2022-03-13 (×4): qty 100

## 2022-03-13 MED ORDER — DEXTROSE 5 % IV SOLN
5.0000 mg/kg | Freq: Two times a day (BID) | INTRAVENOUS | Status: DC
  Administered 2022-03-13: 465 mg via INTRAVENOUS
  Filled 2022-03-13 (×2): qty 9.3

## 2022-03-13 MED ORDER — METRONIDAZOLE 500 MG/100ML IV SOLN
500.0000 mg | Freq: Two times a day (BID) | INTRAVENOUS | Status: DC
  Filled 2022-03-13: qty 100

## 2022-03-13 MED ORDER — POTASSIUM CHLORIDE CRYS ER 20 MEQ PO TBCR
40.0000 meq | EXTENDED_RELEASE_TABLET | Freq: Once | ORAL | Status: DC
Start: 2022-03-13 — End: 2022-03-13

## 2022-03-13 MED ORDER — METHYLPREDNISOLONE SODIUM SUCC 125 MG IJ SOLR
80.0000 mg | Freq: Every day | INTRAMUSCULAR | Status: DC
  Administered 2022-03-13: 80 mg via INTRAVENOUS
  Filled 2022-03-13: qty 2

## 2022-03-13 MED ORDER — DEXTROSE 5 % IV SOLN: 5.0000 mg/kg | Freq: Two times a day (BID) | INTRAVENOUS | Status: DC

## 2022-03-13 MED ORDER — POTASSIUM CHLORIDE 10 MEQ/100ML IV SOLN: 10.0000 meq | INTRAVENOUS | Status: DC

## 2022-03-13 MED ORDER — METRONIDAZOLE 500 MG/100ML IV SOLN
500.0000 mg | Freq: Two times a day (BID) | INTRAVENOUS | Status: DC
Start: 2022-03-13 — End: 2022-03-14
  Administered 2022-03-13: 500 mg via INTRAVENOUS
  Filled 2022-03-13 (×2): qty 100

## 2022-03-13 NOTE — Progress Notes (Signed)
Transfererd to [facility] at 2300 hours. Son  and receiving unit informed.

## 2022-03-13 NOTE — Progress Notes (Signed)
Report called to [place] RN on 4300 unit, family at bedside have been updated. Transport called from [place], family aware of the wait. MD aware of the available bed

## 2022-03-13 NOTE — Progress Notes (Signed)
PROGRESS NOTE    Antonio Barnes.  ZDG:[id number] DOB: [birth year] DOA: 03/11/2022 PCP: Rusty Aus, MD  Outpatient Specialists: [facility]    Brief Narrative:  From admission h and p Mr. Antonio Barnes, a 76 y/o with multiple medical problems including DM2 -diet controlled, HTN, Hypothyroidism, HTN, psoriasis on immunologic therapy and immunocompromised has been follow for pharyngitis by his outpatient physician. He was treated with abx. He subsequently developed oral plague extending to posterior pharynx and was started on nystatis swish and swallow and oral fluconazole. His symptoms progressed to include swelling of the tongue with worsening plaque. He had decreased PO intake as a result with concomittant weight loss. He was seen in clinic on the day of admission and referred to [facility] for further evaluation and treatment.    There was some concern of Katherina Right, unclear offending agent.  Mycoplasma related mucositis is another possibility.  HSV PCR was also sent.  Due to worsening of his symptoms especially eye lesions and sore throat, he need tertiary care center. Patient was placed on Zithromax and Flagyl for anaerobe and mycoplasma coverage. Also started on prednisone today to see if that will give him any relief due to worsening sore throat causing difficulty swallowing and speaking.  Prior provider tried transferring him, [facility] accepted the transfer, now had a bed and most likely be transferred to [facility] during night.   Assessment & Plan:   Principal Problem:   AKI (acute kidney injury) ([place]) Active Problems:   Thrush of mouth and esophagus ([place])   HTN (hypertension)   Conjunctivitis   Psoriasis   CAD (coronary artery disease)   Diabetes mellitus with peripheral angiopathy (HCC)   Prostate cancer (HCC)   Psoriatic arthritis ([place])   Hypothyroidism   Pharyngitis  # Pharyngitis # Rash Pharyngitis began a little over a week ago. Has experienced tongue swelling and throat swelling  with that. Also had subjective fever and chills. Started on cefdinir by pcp empirically for GAS. Several days ago rash appeared on trunk and arms and is now diffusely covering body though sparing palms/soles. Is immune compromised (enbrel). Respiratory status is not compromised. Here HIV neg, group a strep pcr neg, monospot neg. Scarlet fever in ddx as is drug reaction (started after cefdinir). CBC unremarkable. Tick-borne illness also on ddx Also concern of Katherina Right although no skin peeling.  Rash mostly involving trunk and proximal extremities, sparing palms and soles. - unifying etiology unclear - ID consult - throat culture - benadryl -Also starting him on steroid. - will hold further fluconazole pending ID eval, do not see overt signs of thrush  # AKI 2/2 decreased oral intake. Baseline cr 1.1, was 2.32 on presentation and has improved to 1.45 today with fluids - continue IVF  # Hypokalemia 3.3. mg 1.8. from decreased po - kcl 60, mg 1 g  # CAD S/p CABG 1998, cath with DES in 2018. Denies chest pain - cont home aspirin, plavix, zetia  # HTN Here normotensive, dehydrated - hold home amlodipine - cont home coreg  # Psoraisis - hold home humira  # Hypothyroid - cont home levothyroxine  # T2DM Diet controlled, a1c 5.7%, here glucose wnl   DVT prophylaxis: lovenox Code Status: full Family Communication: Discussed with son at bedside  Level of care: Stepdown Status is: Inpatient  Consultants:  ID  Procedures: none  Antimicrobials:  Zithromax Flagyl   Subjective: Worsening sore throat, unable to swallow anything at this time.  Having difficulty speaking.  He  was requesting to help him with this awful pain.  Objective: Vitals:   03/13/22 1000 03/13/22 1100 03/13/22 1200 03/13/22 1300  BP: (!) 122/53 (!) 128/48 (!) 144/71 (!) 161/71  Pulse: 75 73 74 74  Resp: '20 16 19 18  '$ Temp:   99.9 F (37.7 C)   TempSrc:   Oral   SpO2: 98% 99% 99% 100%  Weight:       Height:        Intake/Output Summary (Last 24 hours) at 03/13/2022 1608 Last data filed at 03/13/2022 1300 Gross per 24 hour  Intake 1633.06 ml  Output 500 ml  Net 1133.06 ml    Filed Weights   03/11/22 0955 03/12/22 2310  Weight: 91.6 kg 93.1 kg    Examination:  General.  Well-developed gentleman in no acute distress.  Significant rash with some ulceration involving bilateral eyelid with surrounding edema, rash involving all extremities, groin area and trunk, sparing palms and soles.  Nikolsky sign negative.  Significant ulceration of oral mucosa, edema of tongue, difficult to see pharynx. Pulmonary.  Lungs clear bilaterally, normal respiratory effort. CV.  Regular rate and rhythm, no JVD, rub or murmur. Abdomen.  Soft, nontender, nondistended, BS positive. CNS.  Alert and oriented .  No focal neurologic deficit. Extremities.  No edema, no cyanosis, pulses intact and symmetrical. Psychiatry.  Judgment and insight appears normal.    Data Reviewed: I have personally reviewed following labs and imaging studies  CBC: Recent Labs  Lab 03/11/22 1008 03/13/22 0458  WBC 8.5 6.5  NEUTROABS 5.3  --   HGB 13.4 11.0*  HCT 39.5 32.0*  MCV 87.4 86.7  PLT 336 960    Basic Metabolic Panel: Recent Labs  Lab 03/11/22 1008 03/12/22 0419 03/13/22 0458  NA 136 137 137  K 3.5 3.1* 3.2*  CL 102 109 109  CO2 25 21* 19*  GLUCOSE 141* 112* 123*  BUN 34* 32* 36*  CREATININE 2.32* 1.45* 1.86*  CALCIUM 9.6 8.4* 8.4*  MG  --  1.8 1.9    GFR: Estimated Creatinine Clearance: 41.1 mL/min (A) (by C-G formula based on SCr of 1.86 mg/dL (H)). Liver Function Tests: Recent Labs  Lab 03/11/22 1008  AST 16  ALT 17  ALKPHOS 48  BILITOT 1.4*  PROT 7.7  ALBUMIN 3.5    No results for input(s): "LIPASE", "AMYLASE" in the last 168 hours. No results for input(s): "AMMONIA" in the last 168 hours. Coagulation Profile: Recent Labs  Lab 03/11/22 1008  INR 1.1    Cardiac  Enzymes: No results for input(s): "CKTOTAL", "CKMB", "CKMBINDEX", "TROPONINI" in the last 168 hours. BNP (last 3 results) No results for input(s): "PROBNP" in the last 8760 hours. HbA1C: Recent Labs    03/11/22 1008  HGBA1C 5.7*    CBG: Recent Labs  Lab 03/12/22 1600 03/12/22 2234 03/12/22 2257 03/13/22 0742 03/13/22 1113  GLUCAP 94 126* 129* 123* 117*    Lipid Profile: No results for input(s): "CHOL", "HDL", "LDLCALC", "TRIG", "CHOLHDL", "LDLDIRECT" in the last 72 hours. Thyroid Function Tests: No results for input(s): "TSH", "T4TOTAL", "FREET4", "T3FREE", "THYROIDAB" in the last 72 hours. Anemia Panel: No results for input(s): "VITAMINB12", "FOLATE", "FERRITIN", "TIBC", "IRON", "RETICCTPCT" in the last 72 hours. Urine analysis:    Component Value Date/Time   COLORURINE YELLOW (A) 03/11/2022 1233   APPEARANCEUR HAZY (A) 03/11/2022 1233   LABSPEC 1.016 03/11/2022 1233   PHURINE 5.0 03/11/2022 1233   GLUCOSEU NEGATIVE 03/11/2022 1233   HGBUR MODERATE (A) 03/11/2022  [place] 03/11/2022 [place] 03/11/2022 1233   PROTEINUR 30 (A) 03/11/2022 1233   NITRITE NEGATIVE 03/11/2022 1233   LEUKOCYTESUR NEGATIVE 03/11/2022 1233   Sepsis Labs: '@LABRCNTIP'$ (procalcitonin:4,lacticidven:4)  ) Recent Results (from the past 240 hour(s))  Culture, blood (Routine x 2)     Status: None (Preliminary result)   Collection Time: 03/11/22 10:08 AM   Specimen: BLOOD  Result Value Ref Range Status   Specimen Description BLOOD BLOOD RIGHT FOREARM  Final   Special Requests   Final    BOTTLES DRAWN AEROBIC AND ANAEROBIC Blood Culture results may not be optimal due to an excessive volume of blood received in culture bottles   Culture   Final    NO GROWTH 2 DAYS Performed at [facility], [address] [place], [place] [address]    Report Status PENDING  Incomplete  Culture, blood (Routine x 2)     Status: None (Preliminary result)    Collection Time: 03/11/22 10:38 AM   Specimen: BLOOD  Result Value Ref Range Status   Specimen Description BLOOD BLOOD LEFT FOREARM  Final   Special Requests   Final    BOTTLES DRAWN AEROBIC AND ANAEROBIC Blood Culture adequate volume   Culture   Final    NO GROWTH 2 DAYS Performed at [facility], [address] [place], [place] [address]    Report Status PENDING  Incomplete  Group A Strep by PCR ([place] Only)     Status: None   Collection Time: 03/11/22 10:38 AM   Specimen: Throat; Sterile Swab  Result Value Ref Range Status   Group A Strep by PCR NOT DETECTED NOT DETECTED Final    Comment: Performed at [facility], [address] [place], [place] [address]  SARS Coronavirus 2 by RT PCR (hospital order, performed in [place] hospital lab) *cepheid single result test* Anterior Nasal Swab     Status: None   Collection Time: 03/11/22 10:38 AM   Specimen: Anterior Nasal Swab  Result Value Ref Range Status   SARS Coronavirus 2 by RT PCR NEGATIVE NEGATIVE Final    Comment: (NOTE) SARS-CoV-2 target nucleic acids are NOT DETECTED.  The SARS-CoV-2 RNA is generally detectable in upper and lower respiratory specimens during the acute phase of infection. The lowest concentration of SARS-CoV-2 viral copies this assay can detect is 250 copies / mL. A negative result does not preclude SARS-CoV-2 infection and should not be used as the sole basis for treatment or other patient management decisions.  A negative result may occur with improper specimen collection / handling, submission of specimen other than nasopharyngeal swab, presence of viral mutation(s) within the areas targeted by this assay, and inadequate number of viral copies (<250 copies / mL). A negative result must be combined with clinical observations, patient history, and epidemiological information.  Fact Sheet for Patients:   https://www.patel.info/  Fact Sheet for Healthcare  Providers: https://hall.com/  This test is not yet approved or  cleared by the [place] FDA and has been authorized for detection and/or diagnosis of SARS-CoV-2 by FDA under an Emergency Use Authorization (EUA).  This EUA will remain in effect (meaning this test can be used) for the duration of the COVID-19 declaration under Section 564(b)(1) of the Act, 21 U.S.C. section 360bbb-3(b)(1), unless the authorization is terminated or revoked sooner.  Performed at [facility], [address], [place], [place] [address]   Culture, group A strep  Status: None (Preliminary result)   Collection Time: 03/12/22  9:41 AM   Specimen: Throat  Result Value Ref Range Status   Specimen Description   Final    THROAT Performed at [facility], [address] [place], [place] [address]    Special Requests   Final    NONE Performed at [facility], [address], [place], [place] [address]    Culture   Final    CULTURE REINCUBATED FOR BETTER GROWTH Performed at [facility], [place] [address] [place], [place] [address]    Report Status PENDING  Incomplete  Respiratory (~20 pathogens) panel by PCR     Status: None   Collection Time: 03/12/22  6:24 PM   Specimen: Nasopharyngeal Swab; Respiratory  Result Value Ref Range Status   Adenovirus NOT DETECTED NOT DETECTED Final   Coronavirus 229E NOT DETECTED NOT DETECTED Final    Comment: (NOTE) The Coronavirus on the Respiratory Panel, DOES NOT test for the novel  Coronavirus (2019 nCoV)    Coronavirus HKU1 NOT DETECTED NOT DETECTED Final   Coronavirus NL63 NOT DETECTED NOT DETECTED Final   Coronavirus OC43 NOT DETECTED NOT DETECTED Final   Metapneumovirus NOT DETECTED NOT DETECTED Final   Rhinovirus / Enterovirus NOT DETECTED NOT DETECTED Final   Influenza A NOT DETECTED NOT DETECTED Final   Influenza B NOT DETECTED NOT DETECTED Final   Parainfluenza Virus 1 NOT DETECTED NOT  DETECTED Final   Parainfluenza Virus 2 NOT DETECTED NOT DETECTED Final   Parainfluenza Virus 3 NOT DETECTED NOT DETECTED Final   Parainfluenza Virus 4 NOT DETECTED NOT DETECTED Final   Respiratory Syncytial Virus NOT DETECTED NOT DETECTED Final   Bordetella pertussis NOT DETECTED NOT DETECTED Final   Bordetella Parapertussis NOT DETECTED NOT DETECTED Final   Chlamydophila pneumoniae NOT DETECTED NOT DETECTED Final   Mycoplasma pneumoniae NOT DETECTED NOT DETECTED Final    Comment: Performed at [facility], [place], [place], [place] [address]         Radiology Studies: No results found.      Scheduled Meds:  artificial tears   Both Eyes Q8H   cycloSPORINE  150 mg Oral BID   diphenhydrAMINE  25 mg Oral TID   enoxaparin (LOVENOX) injection  40 mg Subcutaneous Q24H   feeding supplement  237 mL Oral TID BM   insulin aspart  0-15 Units Subcutaneous TID WC   magic mouthwash w/lidocaine  5 mL Oral QID   methylPREDNISolone (SOLU-MEDROL) injection  80 mg Intravenous Daily   multivitamin with minerals  1 tablet Oral Daily   trimethoprim-polymyxin b  2 drop Both Eyes Q4H   Continuous Infusions:  sodium chloride Stopped (03/13/22 1048)   acyclovir 465 mg (03/13/22 1606)   azithromycin 250 mL/hr at 03/13/22 1300   metronidazole 100 mL/hr at 03/13/22 1300     LOS: 1 day   This record has been created using Systems analyst. Errors have been sought and corrected,but may not always be located. Such creation errors do not reflect on the standard of care.   Lorella Nimrod, MD [facility]   If 7PM-7AM, please contact night-coverage [email or web address] Password [facility] 03/13/2022, 4:08 PM

## 2022-03-13 NOTE — Progress Notes (Signed)
Date of Admission:  03/11/2022     ID: Antonio Barnes. is a 76 y.o. male  Principal Problem:   AKI (acute kidney injury) ([place]) Active Problems:   CAD (coronary artery disease)   Diabetes mellitus with peripheral angiopathy (HCC)   Prostate cancer ([place])   Psoriatic arthritis ([place])   Thrush of mouth and esophagus (HCC)   Hypothyroidism   HTN (hypertension)   Psoriasis   Conjunctivitis   Pharyngitis    Subjective: Pt says his throat is very sore and unable to swallow Not feeling good Eyes more swollen  and itchy  Medications:   artificial tears   Both Eyes Q8H   cycloSPORINE  150 mg Oral BID   diphenhydrAMINE  25 mg Oral TID   enoxaparin (LOVENOX) injection  40 mg Subcutaneous Q24H   feeding supplement  237 mL Oral TID BM   insulin aspart  0-15 Units Subcutaneous TID WC   magic mouthwash w/lidocaine  5 mL Oral QID   multivitamin with minerals  1 tablet Oral Daily   trimethoprim-polymyxin b  2 drop Both Eyes Q4H    Objective: Vital signs in last 24 hours: Temp:  [98.4 F (36.9 C)-102 F (38.9 C)] 100.6 F (38.1 C) (06/28 0400) Pulse Rate:  [31-88] 66 (06/28 0900) Resp:  [14-29] 17 (06/28 0900) BP: (113-140)/(40-112) 113/43 (06/28 0900) SpO2:  [95 %-100 %] 97 % (06/28 0900) Weight:  [93.1 kg] 93.1 kg (06/27 2310)   PHYSICAL EXAM:  General: ill,  Head: Normocephalic, without obvious abnormality, atraumatic. Eyes: Conjunctivae hyperemic, lids have secretions  ENT Nares normal. No drainage or sinus tenderness. Oral cavity- ulceration tongue, oral mucosa and lips  Neck: , symmetrical, no adenopathy, thyroid: non tender no carotid bruit and no JVD.  Lungs: b/l air entry  Heart: Regular rate and rhythm, no murmur, rub or gallop. Abdomen: Soft, non-tender,not distended. Bowel sounds normal. No masses Extremities: atraumatic, no cyanosis. No edema. No clubbing Skin: erythematous rash over the entire body, splitting of skin behind ears, groin, penis Also has  brusing over arms and legs and hyperpigmented areas from healed psoriasis Lymph: Cervical, supraclavicular normal. Neurologic: Grossly non-focal  Lab Results Recent Labs    03/11/22 1008 03/12/22 0419 03/13/22 0458  WBC 8.5  --  6.5  HGB 13.4  --  11.0*  HCT 39.5  --  32.0*  NA 136 137 137  K 3.5 3.1* 3.2*  CL 102 109 109  CO2 25 21* 19*  BUN 34* 32* 36*  CREATININE 2.32* 1.45* 1.86*   Liver Panel Recent Labs    03/11/22 1008  PROT 7.7  ALBUMIN 3.5  AST 16  ALT 17  ALKPHOS 48  BILITOT 1.4*  Microbiology: [facility]  so far Studies/Results: CT Soft Tissue Neck Wo Contrast  Result Date: 03/11/2022 CLINICAL DATA:  Epiglottitis or tonsillitis suspected EXAM: CT NECK WITHOUT CONTRAST TECHNIQUE: Multidetector CT imaging of the neck was performed following the standard protocol without intravenous contrast. RADIATION DOSE REDUCTION: This exam was performed according to the departmental dose-optimization program which includes automated exposure control, adjustment of the mA and/or kV according to patient size and/or use of iterative reconstruction technique. COMPARISON:  None Available. FINDINGS: Pharynx and larynx: Unremarkable.  No mass or swelling. Salivary glands: Parotid and submandibular glands are unremarkable. Thyroid: Unremarkable. Lymph nodes: No enlarged nodes. Nonspecific increased number of nonenlarged nodes. Vascular: Mild calcified plaque at the common carotid bifurcations. Limited intracranial: No acute abnormality. Visualized orbits: Unremarkable. Mastoids and visualized  paranasal sinuses: No significant opacification. Skeleton: Cervical spine degenerative changes. Upper chest: No apical lung mass. Other: None. IMPRESSION: No significant phlegm a tori changes. Nonspecific increased number of nonenlarged lymph nodes. Electronically Signed   By: Macy Mis M.D.   On: 03/11/2022 11:32   DG Chest 2 View  Result Date: 03/11/2022 CLINICAL DATA:  Suspected sepsis with  weakness in a 76 year old male. EXAM: CHEST - 2 VIEW COMPARISON:  March 21, 2015 CT of the chest from May of 2020. FINDINGS: Cardiomediastinal contours and hilar structures are stable. Changes of median sternotomy and CABG are again noted. Biapical pleural and parenchymal scarring is unchanged. No signs of lobar consolidation. No visible pneumothorax. No sign of pleural effusion. Subtle opacity in the lingula along the LEFT heart border seen best on lateral projection. On limited assessment there is no acute skeletal finding. IMPRESSION: 1. Subtle opacity in the lingula along the LEFT heart border, could represent atelectasis scarring or developing infection. Consider follow-up PA and lateral chest to ensure resolution. 2. No evidence of lobar consolidation. Electronically Signed   By: Zetta Bills M.D.   On: 03/11/2022 10:25     Assessment/Plan:  Pt with psoriasis on TNFI enbrel, CAD. S/P CABG and stent presents with painful , sore throat, eye discharge and pain and a rash He started with sore throat and sinus inf like symptoms around June 13th, developed fever around June 18th, called PCP and got cefdinir on 6/20, with no improvement and saw him in his clinic on 6/22 and given fluconazole for possible candida, and asked to continue omnicef- did not improve- On his visit on 6/26 as he had skin rash and eye involvement, hypotensive he was asked to go to the ED  Severe oral mucositis, severe conjunctivitis and a disseminated  macular erythematous  rash with no epidermal detachment  At first blush it may look like drug induced like cephalosporin which was started on 6/20 But pt had sore throat prior to that. In that case could it be another drug? He has had no changes in his treatment- been taking the other meds for a long time Could it be MIRM ( mycoplasma pneumoniae induced rash and mucositis)? He started with upper resp symptoms CXR yesterday had a faint infiltrate left side Will add azithromycin  '500mg'$  QD Will also add flagyl for anerobic coverage of oral organisms  Could it be due to HSV? Oral reactivation? No erythema multiforme like lesions, palms /soles spared HSV PCR sent Will start Acyclovir '5mg'$ /kg adjusted to crcl  Is this Kathreen Cosier syndrome? But there is no epidermal detachment  Discussed with hospitalist- They will start steroids today He was started on cyclosporine by Dr.Wouk.  ? Discontinue   Discussed the management with his son Case conference in the room with Dr,Amin, myself, son, patient Pt will be transferred to [facility]

## 2022-03-14 LAB — CULTURE, GROUP A STREP (THRC)

## 2022-03-14 LAB — MYCOPLASMA PNEUMONIAE ANTIBODY, IGM: Mycoplasma pneumo IgM: <770 U/mL (ref 0–769)

## 2022-03-16 LAB — CULTURE, BLOOD (ROUTINE X 2)
Culture: NO GROWTH
Culture: NO GROWTH
Special Requests: ADEQUATE

## 2022-04-03 LAB — HSV DNA BY PCR (REFERENCE LAB)
HSV 1 DNA: POSITIVE — AB
HSV 2 DNA: NEGATIVE

## 2024-09-20 NOTE — ED Provider Notes (Signed)
 [facility] Emergency Department Provider Note   ED Clinical Impression   Final diagnoses:  Suprapubic catheter dysfunction, initial encounter (Primary)    Impression, Medical Decision Making, ED Course   Medical Decision Making This is a 79 year old male with a history of coronary artery disease, hypertension, hypothyroidism, diabetes, hyperlipidemia, prostate cancer, and urinary retention presented after recurrent displacement of his suprapubic urinary catheter, which has occurred with increasing frequency over the past two months. The catheter was initially placed due to complications from a severe bacterial infection requiring orchiectomy. He reports gradual extrusion of the catheter over several hours, with uncertainty regarding proper placement and balloon inflation. No additional symptoms or acute complaints were noted on presentation.  Recurrent displacement of suprapubic urinary catheter - Plan to replace suprapubic catheter    Diagnostic workup as below.   Orders Placed This Encounter  Procedures   BLADDER CATHETERIZATION   Suprapubic catheter    ED Course as of 09/20/24 1218  Mon Sep 20, 2024  9042 Patient's catheter was replaced with a 14 French suprapubic catheter.  After replacement, it was draining urine well.  Patient stable for discharge home.  He was told to return if he experiences any other issues with his catheter.    MDM Elements Discussion of Management with other Physicians, QHP or Appropriate Source: None  I have reviewed recent and relevant previous record, including: Inpatient notes - reviewed ED note on 09/18/2024 Escalation of Care including OBS/Admission/Transfer was considered: However, patient was determined to be appropriate for outpatient management.    ____________________________________________  The case was discussed with the attending physician, who is in agreement with the above assessment and plan.    History   Chief  Complaint Chief Complaint  Patient presents with   Suprapubic Catheter Problem    HPI   History of Present Illness Antonio Barnes is a 79 year old male with a history of suprapubic catheter use who presents with catheter dislodgement.  He experienced dislodgement of his suprapubic catheter this morning. The catheter initially dislodged on Friday, prompting him to seek medical attention on Saturday, where it was replaced. However, it dislodged again today. He describes a sensation of the catheter gradually coming out over a couple of hours and attempts to push it back in as much as possible. He is uncertain if the issue is due to improper placement or balloon malfunction.  He has had a suprapubic catheter for approximately six to seven months following a period of urinary retention. Prior to this, he had a urethral catheter for over a year. 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 Since then, he has required catheterization.  He notes that the catheter dislodgement has been occurring more frequently over the past two months, although previously he could go up to thirty days without an issue.    Past Medical History[1]  Past Surgical History[2]  Active Medications[3]   Allergies[4]  Family History[5]  Short Social History[6]   Physical Exam   VITAL SIGNS:    Vitals:   09/20/24 0812 09/20/24 0814 09/20/24 1000  BP:  (!) 191/113 (!) 188/106  Pulse:  83 72  Resp:  15 18  Temp:  36.7 C (98 F) 36.7 C (98.1 F)  TempSrc:  Temporal Oral  SpO2:  99% 100%  Weight: 83.5 kg (184 lb)      Constitutional: Alert and oriented. No acute distress.  Eyes: Conjunctivae are normal. HEENT: Normocephalic and atraumatic. Conjunctivae clear. No congestion. Moist mucous membranes.  Cardiovascular: Rate as above, regular rhythm. Normal and symmetric distal pulses.  Brisk capillary refill. Normal skin turgor. Respiratory: Normal respiratory effort. Breath sounds are normal. There are no wheezing or crackles heard. Gastrointestinal: Soft, non-distended, non-tender.  Suprapubic catheter in place draining clear urine. Genitourinary: Deferred. Musculoskeletal: Non-tender with normal range of motion in all extremities. Neurologic: Normal speech and language. No gross focal neurologic deficits are appreciated. Patient is moving all extremities equally, face is symmetric at rest and with speech. Skin: Skin is warm, dry and intact. No rash noted. Psychiatric: Mood and affect are normal. Speech and behavior are normal.   Radiology   No orders to display    Pertinent labs & imaging results that were available during my care of the patient were independently interpreted by me and considered in my medical decision making (see chart for details).  Portions of this record have been created using Scientist, clinical (histocompatibility and immunogenetics). Dictation errors have been sought, but may not have been identified and corrected.        [1] Past Medical History: Diagnosis Date   Prostate cancer    (CMS-HCC)   [2] Past Surgical History: Procedure Laterality Date   PR CYSTOSCOPY,INSERT URETERAL STENT Right 12/12/2022   Procedure: CYSTOURETHROSCOPY,  WITH INSERTION OF INDWELLING URETERAL STENT (EG, GIBBONS OR DOUBLE-J TYPE);  Surgeon: Drena Darryle Gull, MD;  Location: MAIN OR [facility];  Service: Urology   PR EXPLORE SCROTUM Left 12/12/2022   Procedure: SCROTAL EXPLORATION;  Surgeon: Drena Darryle Gull, MD;  Location: [facility];  Service: Urology   PR REMOVAL TESTIS,SIMPLE Left 12/12/2022   Procedure: LEFT ORCHIECTOMY;  Surgeon: Drena Darryle Gull, MD;  Location: MAIN OR [facility];  Service: Urology   PR UPPER GI ENDOSCOPY,BIOPSY N/A 06/05/2022   Procedure: UGI ENDOSCOPY; WITH BIOPSY, SINGLE OR MULTIPLE;  Surgeon: Darrick Peck, MD;  [facility]: [facility];  Service:  Gastroenterology  [3] No current facility-administered medications for this encounter.   Current Outpatient Medications  Medication Sig Dispense Refill   blood sugar diagnostic (GLUCOSE BLOOD) Strp Check blood sugars once daily as instructed by clinic. Dispense per insurance coverage. ICD-10 E11.9 100 strip 3   blood-glucose meter kit Use as instructed 1 each 0   fluticasone furoate-vilanterol (BREO ELLIPTA) 200-25 mcg/dose inhaler Inhale 1 puff daily. 720 each 0   insulin  lispro (HUMALOG) 100 unit/mL injection pen Start taking insulin  lispro 16 units with meals with an additional 1 unit for every blood glucose level 50 over 150 mg/dL as follows:   If blood glucose level 151-200 mg/dL, take 1 unit of insulin  ;  If blood glucose level 201-250 mg/dL, take 2 units of insulin  ;  If blood glucose level 251-300 mg/dL, take 3 units of insulin  ;  If blood glucose level 301-350 mg/dL, take 4 units of insulin  ;  If blood glucose level >350 mg/dL, take 5 units of insulin  15 mL 0   insulin  NPH isoph U-100 human (HUMULIN) 100 unit/mL (3 mL) injection pen Inject 18 Units under the skin daily. Take around same time as daily prednisone dose. HOLD if holding prednisone dose. 15 mL 0   lancets Misc Test 1 time daily as directed 100 each 11   MEDICAL SUPPLY ITEM Use to check sugars while on steroids 1 each 0   multivitamin/iron/folic acid  (CENTRUM COMPLETE ORAL) Take 1 tablet by mouth in the morning. (Patient not taking: Reported on 01/27/2024)  naproxen  sodium (ALEVE  ORAL) Take by mouth.     NIFEdipine (PROCARDIA XL) 30 MG 24 hr tablet Take 1 tablet (30 mg total) by mouth daily. 90 tablet 3   pen needle, diabetic 32 gauge x 5/32 (4 mm) Ndle Use with insulin  up to 4 times/day as needed. 100 each 0  [4] Allergies Allergen Reactions   Levofloxacin Rash    Diffuse rash with onset 5 days after levoflox; included lip swelling   Cefdinir Rash    Maculopapular rash; biopsy with perivascular dermatitis  with eosinophils  [5] Family History Problem Relation Age of Onset   Prostate cancer Neg Hx    Melanoma Neg Hx    Basal cell carcinoma Neg Hx    Squamous cell carcinoma Neg Hx   [6] Social History Tobacco Use   Smoking status: Former    Current packs/day: 0.00    Types: Cigarettes    Quit date: 11/14/2012    Years since quitting: 11.8   Smokeless tobacco: Never  Substance Use Topics   Alcohol use: Not Currently    Alcohol/week: 3.0 standard drinks of alcohol    Types: 3 Cans of beer per week   Drug use: No   Malka Waddell HERO, DO Resident 09/20/24 [phone]

## 2024-09-22 ENCOUNTER — Other Ambulatory Visit: Payer: Self-pay

## 2024-09-22 ENCOUNTER — Emergency Department
Admission: EM | Admit: 2024-09-22 | Discharge: 2024-09-22 | Disposition: A | Discharge status: Other Acute Inpatient Hospital | Attending: Emergency Medicine | Admitting: Emergency Medicine

## 2024-09-22 ENCOUNTER — Emergency Department

## 2024-09-22 ENCOUNTER — Encounter: Payer: Self-pay | Admitting: Emergency Medicine

## 2024-09-22 DIAGNOSIS — I2541 Coronary artery aneurysm: Secondary | ICD-10-CM | POA: Diagnosis not present

## 2024-09-22 DIAGNOSIS — E119 Type 2 diabetes mellitus without complications: Secondary | ICD-10-CM | POA: Insufficient documentation

## 2024-09-22 DIAGNOSIS — I10 Essential (primary) hypertension: Secondary | ICD-10-CM | POA: Insufficient documentation

## 2024-09-22 DIAGNOSIS — J45909 Unspecified asthma, uncomplicated: Secondary | ICD-10-CM | POA: Insufficient documentation

## 2024-09-22 DIAGNOSIS — R079 Chest pain, unspecified: Secondary | ICD-10-CM

## 2024-09-22 DIAGNOSIS — R0789 Other chest pain: Secondary | ICD-10-CM | POA: Diagnosis present

## 2024-09-22 DIAGNOSIS — I2510 Atherosclerotic heart disease of native coronary artery without angina pectoris: Secondary | ICD-10-CM | POA: Insufficient documentation

## 2024-09-22 LAB — COMPREHENSIVE METABOLIC PANEL WITH GFR
ALT: 6 U/L (ref 0–44)
AST: 17 U/L (ref 15–41)
Albumin: 4.2 g/dL (ref 3.5–5.0)
Alkaline Phosphatase: 78 U/L (ref 38–126)
Anion gap: 15 (ref 5–15)
BUN: 18 mg/dL (ref 8–23)
CO2: 22 mmol/L (ref 22–32)
Calcium: 9.6 mg/dL (ref 8.9–10.3)
Chloride: 102 mmol/L (ref 98–111)
Creatinine, Ser: 1.15 mg/dL (ref 0.61–1.24)
GFR, Estimated: >60 mL/min
Glucose, Bld: 163 mg/dL — ABNORMAL HIGH (ref 70–99)
Potassium: 3.1 mmol/L — ABNORMAL LOW (ref 3.5–5.1)
Sodium: 139 mmol/L (ref 135–145)
Total Bilirubin: 0.6 mg/dL (ref 0.0–1.2)
Total Protein: 7.2 g/dL (ref 6.5–8.1)

## 2024-09-22 LAB — CBC
HCT: 37.6 % — ABNORMAL LOW (ref 39.0–52.0)
Hemoglobin: 12.7 g/dL — ABNORMAL LOW (ref 13.0–17.0)
MCH: 27.1 pg (ref 26.0–34.0)
MCHC: 33.8 g/dL (ref 30.0–36.0)
MCV: 80.2 fL (ref 80.0–100.0)
Platelets: 307 K/uL (ref 150–400)
RBC: 4.69 MIL/uL (ref 4.22–5.81)
RDW: 16 % — ABNORMAL HIGH (ref 11.5–15.5)
WBC: 8.5 K/uL (ref 4.0–10.5)
nRBC: 0 % (ref 0.0–0.2)

## 2024-09-22 LAB — TROPONIN T, HIGH SENSITIVITY
Troponin T High Sensitivity: 30 ng/L — ABNORMAL HIGH (ref 0–19)
Troponin T High Sensitivity: 43 ng/L — ABNORMAL HIGH (ref 0–19)
Troponin T High Sensitivity: 105 ng/L (ref 0–19)

## 2024-09-22 MED ORDER — LABETALOL HCL 5 MG/ML IV SOLN
10.0000 mg | Freq: Once | INTRAVENOUS | Status: AC
  Administered 2024-09-22: 10 mg via INTRAVENOUS
  Filled 2024-09-22: qty 4

## 2024-09-22 MED ORDER — IOHEXOL 350 MG/ML SOLN
100.0000 mL | Freq: Once | INTRAVENOUS | Status: AC | PRN
  Administered 2024-09-22: 100 mL via INTRAVENOUS

## 2024-09-22 MED ORDER — NITROGLYCERIN 0.4 MG SL SUBL
0.4000 mg | SUBLINGUAL_TABLET | SUBLINGUAL | Status: AC | PRN
  Administered 2024-09-22 (×3): 0.4 mg via SUBLINGUAL
  Filled 2024-09-22 (×3): qty 1

## 2024-09-22 MED ORDER — ESMOLOL HCL-SODIUM CHLORIDE 2000 MG/100ML IV SOLN
25.0000 ug/kg/min | INTRAVENOUS | Status: DC
  Administered 2024-09-22: 25 ug/kg/min via INTRAVENOUS
  Filled 2024-09-22: qty 100

## 2024-09-22 MED ORDER — ASPIRIN 81 MG PO CHEW
324.0000 mg | CHEWABLE_TABLET | Freq: Once | ORAL | Status: AC
  Administered 2024-09-22: 324 mg via ORAL
  Filled 2024-09-22: qty 4

## 2024-09-22 MED ORDER — MORPHINE SULFATE (PF) 4 MG/ML IV SOLN
4.0000 mg | Freq: Once | INTRAVENOUS | Status: AC
  Administered 2024-09-22: 4 mg via INTRAVENOUS
  Filled 2024-09-22: qty 1

## 2024-09-22 NOTE — ED Notes (Signed)
EMTALA reviewed. 

## 2024-09-22 NOTE — ED Notes (Signed)
 Discussed elevated BP trend with EDP [place]. No new orders at this time. Pending CTA. Pt and pt family updated in current POC.

## 2024-09-22 NOTE — ED Triage Notes (Signed)
 Pt to triage via w/c with no distress; c/o upper CP radiating into neck/jaw and head since 11pm; denies any accomp symptoms

## 2024-09-22 NOTE — ED Provider Notes (Signed)
 Clinical Course as of 09/22/24 0920  Wed Sep 22, 2024  0708 Received signout: sudden onset jaw pain, radiating to chest, 3 SL nitro. Pain and BP improved. EKG initially had ST changes. CT of chest, pseudoaneurys over right bypass graft, saw it 2020, increased in size since then, thrombosed, new fluid collection along posterlateral aspect of atrium. Not taking medications Loss of follow up. Follows with [facility] urology suprapubic wise.  Attempting to transfer [facility]. Got labetol  [HD]  [id number] Troponin T High Sensitivity(!!): 105 Elevating [HD]  [id number] Patient reassessed, still in pain.  He understands the urgency of the situation.  We have not yet received a callback from [facility] but given his elevated blood pressure still we will place him on a esmolol  drip [HD]  0736 Accepted by Dr. Gari.  Agree with esmolol  drip.  [HD]  0739 Family updated [HD]  0911 [facility] transport here.  [HD]  9085 Signout given to [facility] flight paramedic [HD]    Clinical Course User Index [HD] Nicholaus Rolland BRAVO, MD   .Critical Care  Performed by: Nicholaus Rolland BRAVO, MD Authorized by: Nicholaus Rolland BRAVO, MD   Critical care provider statement:    Critical care time (minutes):  65   Critical care was necessary to treat or prevent imminent or life-threatening deterioration of the following conditions:  Circulatory failure   Critical care was time spent personally by me on the following activities:  Development of treatment plan with patient or surrogate, discussions with consultants, evaluation of patient's response to treatment, examination of patient, ordering and review of laboratory studies, ordering and review of radiographic studies, ordering and performing treatments and interventions, pulse oximetry, re-evaluation of patient's condition and review of old charts   I assumed direction of critical care for this patient from another provider in my specialty: yes     Care discussed with: admitting provider   Comments:     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

## 2024-09-22 NOTE — ED Provider Notes (Signed)
 "  [facility] Provider Note    Event Date/Time   First MD Initiated Contact with Patient 09/22/24 0111     (approximate)   History   Chief Complaint Chest Pain   HPI  Antonio Barnes. is a 79 y.o. male with past medical history of hypertension, diabetes, CAD, and asthma who presents to the ED complaining of chest pain.  Patient reports that about 1 hour prior to arrival he had acute onset of discomfort in both sides of his jaw and radiating down towards his chest.  He describes the pain as a dull ache rated as a 9 out of 10 and not exacerbated or alleviated by anything in particular.  He denies any associated shortness of breath and has not had any recent fevers or cough.  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     Physical Exam   Triage Vital Signs: ED Triage Vitals  Encounter Vitals Group     BP 09/22/24 0103 (!) 200/114     Girls Systolic BP Percentile --      Girls Diastolic BP Percentile --      Boys Systolic BP Percentile --      Boys Diastolic BP Percentile --      Pulse Rate 09/22/24 0103 91     Resp 09/22/24 0103 18     Temp 09/22/24 0103 97.6 F (36.4 C)     Temp Source 09/22/24 0103 Oral     SpO2 09/22/24 0103 97 %     Weight 09/22/24 0100 184 lb (83.5 kg)     Height 09/22/24 0100 6' 2 (1.88 m)     Head Circumference --      Peak Flow --      Pain Score 09/22/24 0100 10     Pain Loc --      Pain Education --      Exclude from Growth Chart --     Most recent vital signs: Vitals:   09/22/24 0630 09/22/24 0700  BP: (!) 193/119 (!) 166/107  Pulse: 77 74  Resp: 16 16  Temp:    SpO2: 96% 96%    Constitutional: Alert and oriented. Eyes: Conjunctivae are normal. Head: Atraumatic. Nose: No congestion/rhinnorhea. Mouth/Throat: Mucous membranes are moist.  Cardiovascular: Normal rate, regular rhythm. Grossly normal heart sounds.  2+ radial pulses  bilaterally. Respiratory: Normal respiratory effort.  No retractions. Lungs CTAB. Gastrointestinal: Soft and nontender. No distention. Musculoskeletal: No lower extremity tenderness nor edema.  Neurologic:  Normal speech and language. No gross focal neurologic deficits are appreciated.    ED Results / Procedures / Treatments   Labs (all labs ordered are listed, but only abnormal results are displayed) Labs Reviewed  CBC - Abnormal; Notable for the following components:      Result Value   Hemoglobin 12.7 (*)    HCT 37.6 (*)    RDW 16.0 (*)    All other components within normal limits  COMPREHENSIVE METABOLIC PANEL WITH GFR - Abnormal; Notable for the following components:   Potassium 3.1 (*)    Glucose, Bld 163 (*)    All other components within normal limits  TROPONIN T, HIGH SENSITIVITY - Abnormal; Notable for the following components:   Troponin T High Sensitivity 30 (*)    All other components within normal limits  TROPONIN T, HIGH SENSITIVITY - Abnormal; Notable for the following components:   Troponin T High  Sensitivity 43 (*)    All other components within normal limits  TROPONIN T, HIGH SENSITIVITY - Abnormal; Notable for the following components:   Troponin T High Sensitivity 105 (*)    All other components within normal limits     EKG  ED ECG REPORT I, Carlin Palin, the attending physician, personally viewed and interpreted this ECG.   Date: 09/22/2024  EKG Time: 1:02  Rate: 91  Rhythm: normal sinus rhythm  Axis: LAD  Intervals:nonspecific intraventricular conduction delay and borderline prolonged QT  ST&T Change: Lateral ST depressions, borderline ST elevation inferiorly  ED ECG REPORT I, Carlin Palin, the attending physician, personally viewed and interpreted this ECG.   Date: 09/22/2024  EKG Time: 1:25  Rate: 89  Rhythm: normal sinus rhythm  Axis: LAD  Intervals:nonspecific intraventricular conduction delay and prolonged QT  ST&T Change:  Lateral ST depressions without ST elevation   RADIOLOGY Chest x-ray reviewed and interpreted by me with rounded mass in the right lower lung field, no focal infiltrate or edema noted.  PROCEDURES:  Critical Care performed: Yes, see critical care procedure note(s)  .Critical Care  Performed by: Palin Carlin, MD Authorized by: Palin Carlin, MD   Critical care provider statement:    Critical care time (minutes):  30   Critical care time was exclusive of:  Separately billable procedures and treating other patients and teaching time   Critical care was necessary to treat or prevent imminent or life-threatening deterioration of the following conditions:  Cardiac failure   Critical care was time spent personally by me on the following activities:  Development of treatment plan with patient or surrogate, discussions with consultants, evaluation of patient's response to treatment, examination of patient, ordering and review of laboratory studies, ordering and review of radiographic studies, ordering and performing treatments and interventions, pulse oximetry, re-evaluation of patient's condition and review of old charts   I assumed direction of critical care for this patient from another provider in my specialty: no     Care discussed with: accepting provider at another facility      MEDICATIONS ORDERED IN ED: Medications  aspirin  chewable tablet 324 mg (324 mg Oral Given 09/22/24 0133)  nitroGLYCERIN  (NITROSTAT ) SL tablet 0.4 mg (0.4 mg Sublingual Given 09/22/24 0204)  morphine  (PF) 4 MG/ML injection 4 mg (4 mg Intravenous Given 09/22/24 0156)  iohexol  (OMNIPAQUE ) 350 MG/ML injection 100 mL (100 mLs Intravenous Contrast Given 09/22/24 0236)  labetalol  (NORMODYNE ) injection 10 mg (10 mg Intravenous Given 09/22/24 0640)     IMPRESSION / MDM / ASSESSMENT AND PLAN / ED COURSE  I reviewed the triage vital signs and the nursing notes.                              79 y.o. male with past medical  history of hypertension, diabetes, CAD, and asthma who presents to the ED complaining of sudden onset pressure radiating from both sides of his jaw down into his chest about 1 hour prior to arrival.  Patient's presentation is most consistent with acute presentation with potential threat to life or bodily function.  Differential diagnosis includes, but is not limited to, ACS, PE, dissection, pneumonia, pneumothorax, musculoskeletal pain, GERD, anxiety.  Patient uncomfortable appearing but in no acute distress, vital signs remarkable for hypertension but otherwise reassuring.  Initial EKG with lateral ST depressions and borderline ST elevation inferiorly.  Serial EKGs were obtained due to concern for ACS and possible  STEMI, however no significant ST elevation noted on 2 subsequent repeats.  Troponin and additional labs are pending, will treat with aspirin , nitroglycerin , and morphine .  Chest x-ray with rounded density in the right lower lobe, will check CTA of his chest to further assess mass as well as assess for possible dissection.  Labs without significant anemia, leukocytosis, electrolyte abnormality, or AKI.  Patient does have mildly elevated troponin at 30, slightly uptrending on recheck to 43.  He does report pain improved following multiple doses of sublingual nitroglycerin  as well as aspirin  and morphine .  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  Patient also has surrounding fluid collection that has mass effect on the right atrium, suspect hematoma.  Patient's original CABG was done in 1999 at [facility] but he has not had any follow-up there since then, currently gets most of his care at [facility].  Plan to discuss with cardiothoracic surgery at [facility] Patient turned over to oncoming provider pending CT surgery consult.  The patient is on the cardiac monitor to  evaluate for evidence of arrhythmia and/or significant heart rate changes.    FINAL CLINICAL IMPRESSION(S) / ED DIAGNOSES   Final diagnoses:  Aneurysmal lesion of coronary artery  Chest pain, unspecified type     Rx / DC Orders   ED Discharge Orders     None        Note:  This document was prepared using Dragon voice recognition software and may include unintentional dictation errors.   Willo Dunnings, MD 09/22/24 [phone]  "

## 2024-09-22 NOTE — ED Notes (Signed)
 CRITICAL VALUE STICKER  CRITICAL VALUE: trop 105  RECEIVER (on-site recipient of call): Juda Toepfer RN   DATE & TIME NOTIFIED: 09/22/24 629   MESSENGER (representative from lab): NA  MD NOTIFIED: [place]   TIME OF NOTIFICATION: 09/22/24 631  RESPONSE: Provider to pt bedside. Pending new orders.
